# Patient Record
Sex: Female | Born: 1985 | Race: White | Hispanic: No | Marital: Married | Smoking: Never smoker
Health system: Southern US, Community
[De-identification: ages and names within clinical notes are randomized; demographics above are authoritative.]

## PROBLEM LIST (undated history)

## (undated) DIAGNOSIS — Z973 Presence of spectacles and contact lenses: Secondary | ICD-10-CM

## (undated) DIAGNOSIS — Z8619 Personal history of other infectious and parasitic diseases: Secondary | ICD-10-CM

## (undated) DIAGNOSIS — Z87412 Personal history of vulvar dysplasia: Secondary | ICD-10-CM

## (undated) DIAGNOSIS — K5909 Other constipation: Secondary | ICD-10-CM

## (undated) DIAGNOSIS — K601 Chronic anal fissure: Secondary | ICD-10-CM

## (undated) DIAGNOSIS — Z87898 Personal history of other specified conditions: Secondary | ICD-10-CM

## (undated) DIAGNOSIS — Z8742 Personal history of other diseases of the female genital tract: Secondary | ICD-10-CM

## (undated) DIAGNOSIS — K219 Gastro-esophageal reflux disease without esophagitis: Secondary | ICD-10-CM

## (undated) HISTORY — PX: KNEE ARTHROSCOPY: SUR90

## (undated) HISTORY — DX: Personal history of other infectious and parasitic diseases: Z86.19

## (undated) HISTORY — PX: OTHER SURGICAL HISTORY: SHX169

---
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
## 2007-05-21 ENCOUNTER — Encounter: Admission: RE | Admit: 2007-05-21 | Discharge: 2007-05-21 | Payer: Self-pay | Admitting: Gastroenterology

## 2007-06-29 ENCOUNTER — Other Ambulatory Visit: Admission: RE | Admit: 2007-06-29 | Discharge: 2007-06-29 | Payer: Self-pay | Admitting: Obstetrics and Gynecology

## 2008-01-04 ENCOUNTER — Other Ambulatory Visit: Admission: RE | Admit: 2008-01-04 | Discharge: 2008-01-04 | Payer: Self-pay | Admitting: Obstetrics and Gynecology

## 2008-07-07 ENCOUNTER — Other Ambulatory Visit: Admission: RE | Admit: 2008-07-07 | Discharge: 2008-07-07 | Payer: Self-pay | Admitting: Obstetrics and Gynecology

## 2008-10-19 IMAGING — RF DG UGI W/ HIGH DENSITY W/KUB
15 of 19 series · 18 of 24 positions shown · non-contrast
Comparison: Abdominal ultrasound, 05/21/07.

CLINICAL DATA: Post prandial abdominal pain, bloating for two months.  Alternating diarrhea and constipation and nausea.
 UPPER GI WITH HIGH DENSITY WITH KUB:

[Series 1: run · 1 of 1 slices shown (1 of 14)]
[im 1/1]
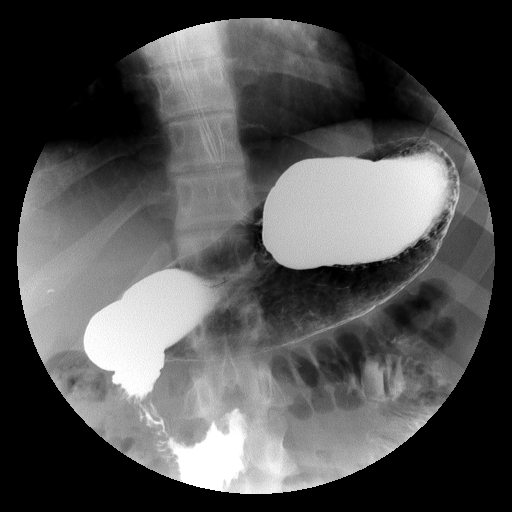

[Series 3: run · 1 of 1 slices shown (2 of 14)]
[im 1/1]
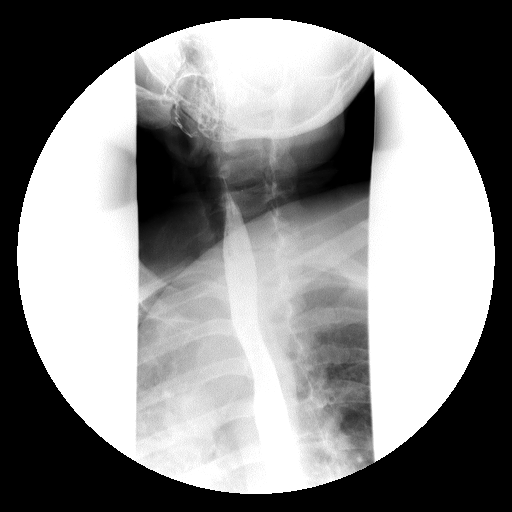

[Series 4: run · 2 of 4 slices shown (3 of 14)]
[im 1/4]
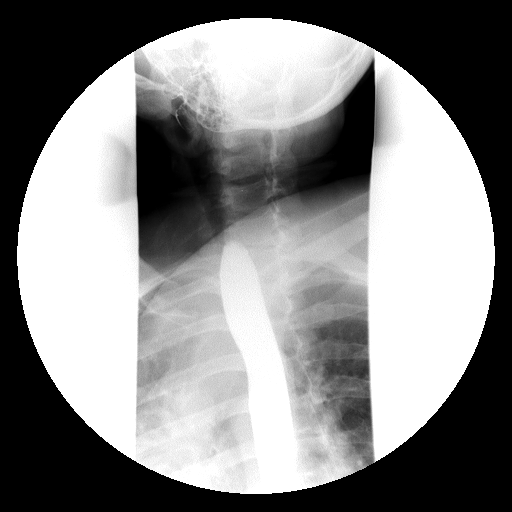
[im 2/4]
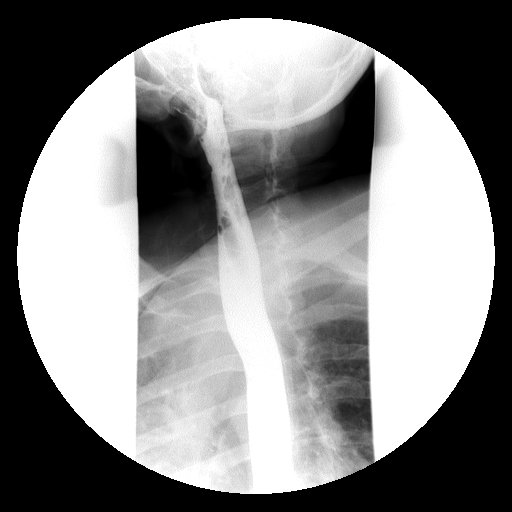

[Series 5: run · 2 of 2 slices shown (4 of 14)]
[im 1/2]
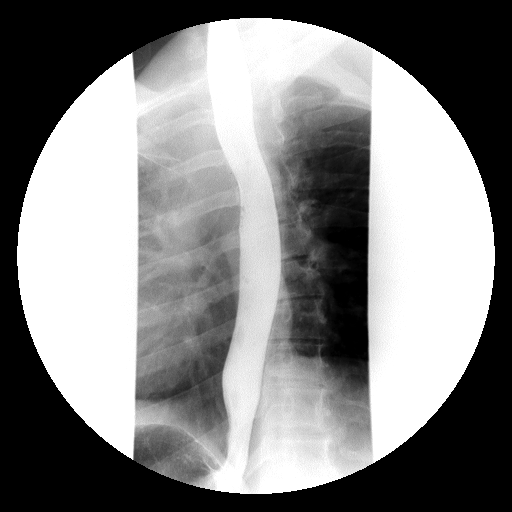
[im 2/2]
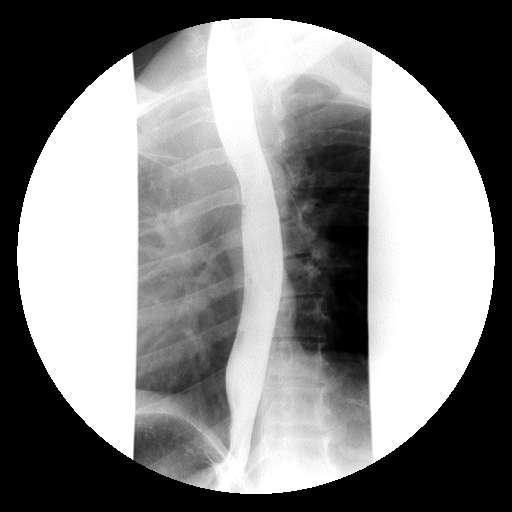

[Series 6: run · 2 of 3 slices shown (5 of 14)]
[im 1/3]
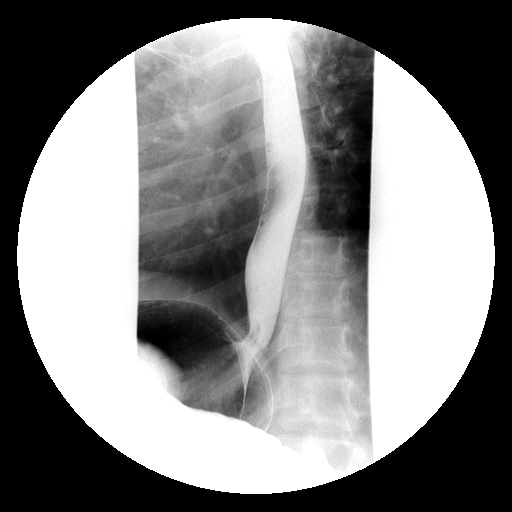
[im 3/3]
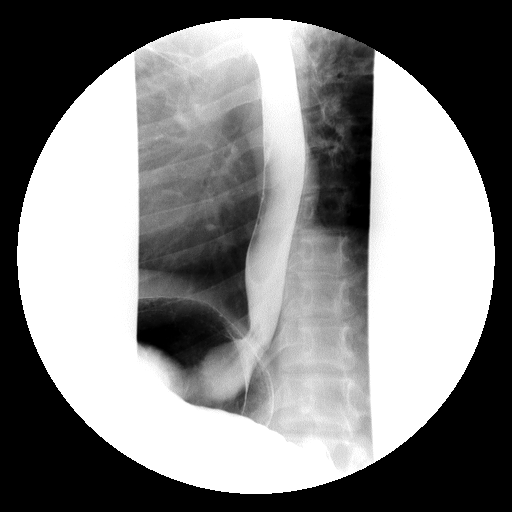

[Series 7: run · 1 of 1 slices shown (6 of 14)]
[im 1/1]
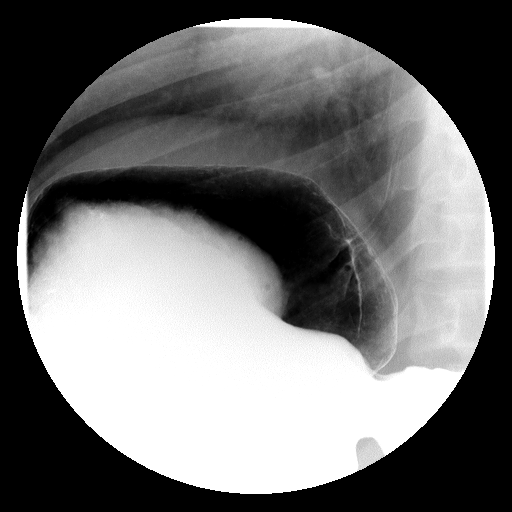

[Series 8: run · 1 of 1 slices shown (7 of 14)]
[im 1/1]
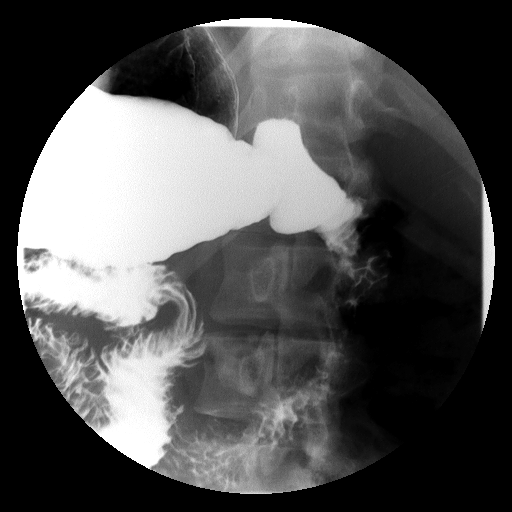

[Series 10: run · 1 of 1 slices shown (8 of 14)]
[im 1/1]
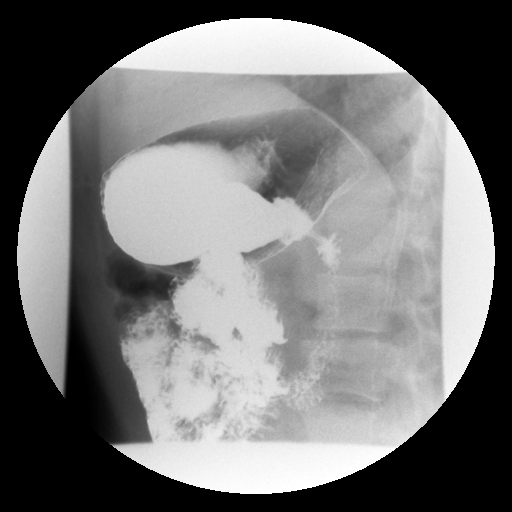

[Series 11: run · 1 of 1 slices shown (9 of 14)]
[im 1/1]
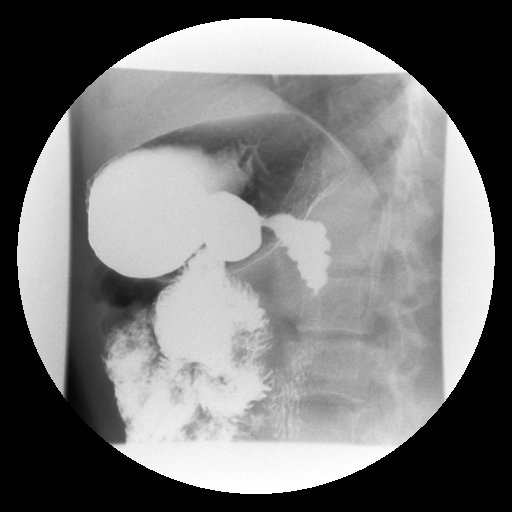

[Series 12: run · 1 of 1 slices shown (10 of 14)]
[im 1/1]
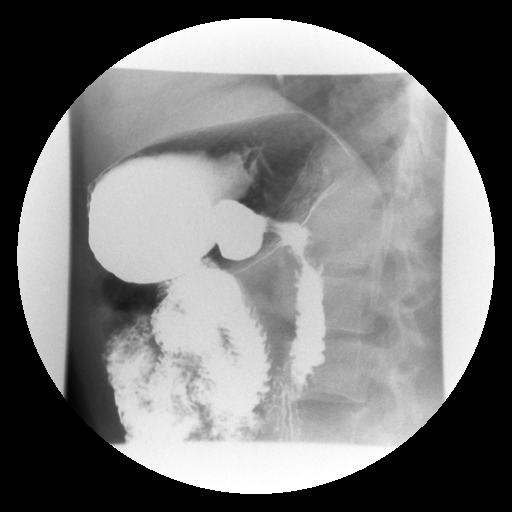

[Series 14: run · 1 of 1 slices shown (11 of 14)]
[im 1/1]
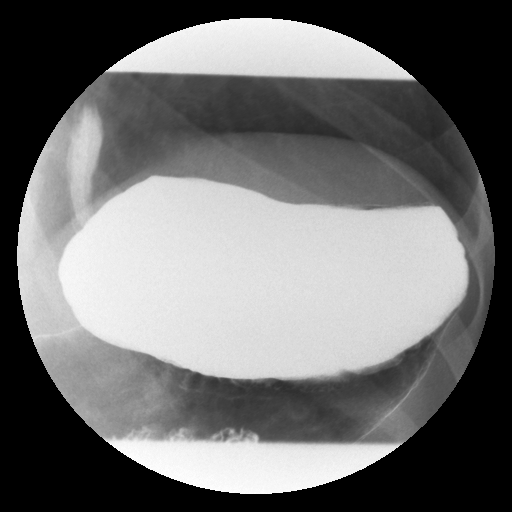

[Series 15: run · 1 of 1 slices shown (12 of 14)]
[im 1/1]
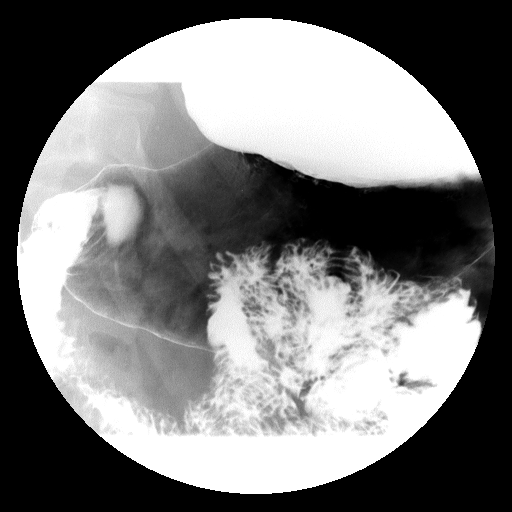

[Series 16: run · 1 of 1 slices shown (13 of 14)]
[im 1/1]
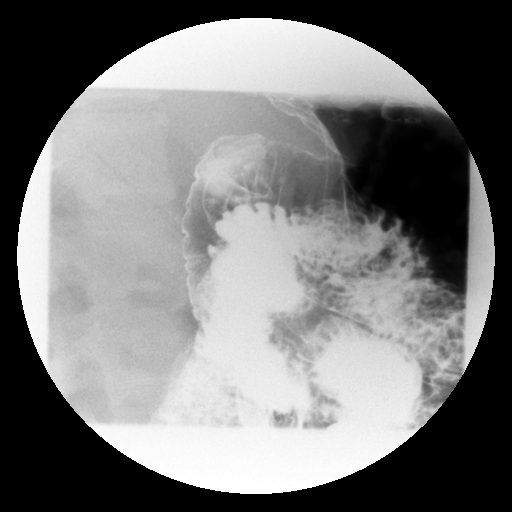

[Series 18: run · 1 of 1 slices shown (14 of 14)]
[im 1/1]
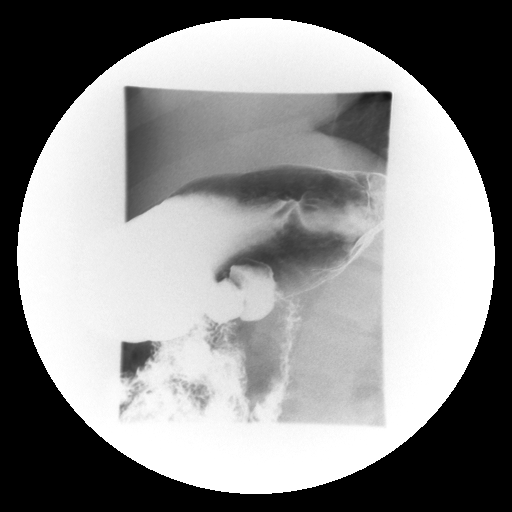

[Series 1001: view not recorded · 0.20mm/px · 1 of 1 slices shown]
[im 1/1]
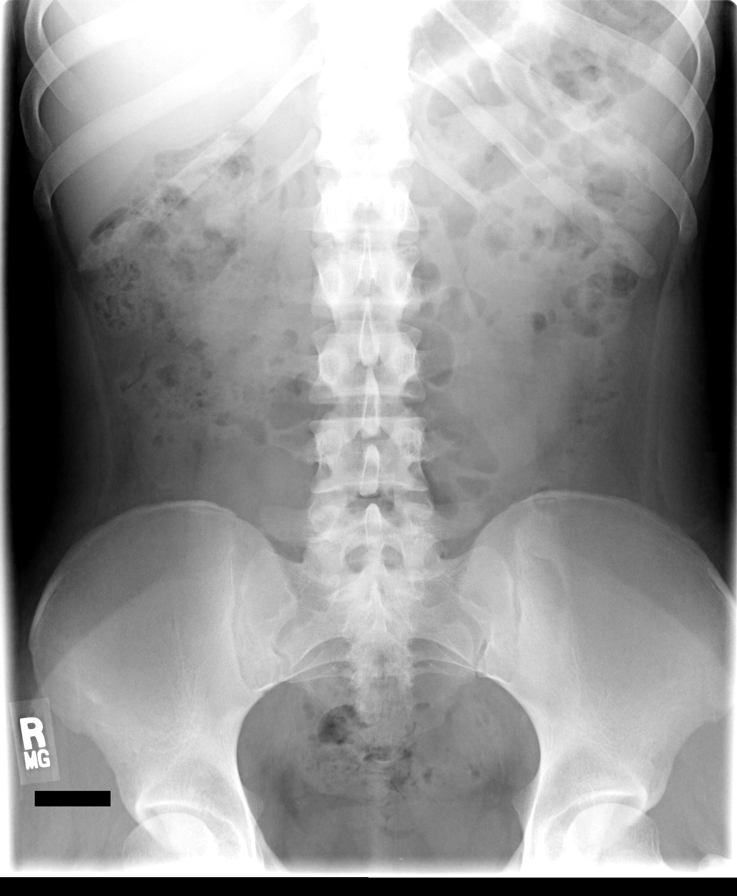

[18 of 24 positions shown; findings below may reference images not displayed]

FINDINGS: The preliminary abdominal radiograph is unremarkable.
 Pediatric technique was utilized.  The esophagus is normal in appearance. There is no evidence of hiatal hernia or gastroesophageal reflux.   Esophageal motility is within normal limits. The stomach, duodenal bulb, and remainder of the duodenum are normal in appearance.
IMPRESSION: Normal upper GI series.

## 2009-02-16 ENCOUNTER — Ambulatory Visit: Admission: RE | Admit: 2009-02-16 | Discharge: 2009-02-16 | Payer: Self-pay | Admitting: Gynecology

## 2009-06-20 ENCOUNTER — Inpatient Hospital Stay (HOSPITAL_COMMUNITY): Admission: AD | Admit: 2009-06-20 | Discharge: 2009-06-22 | Payer: Self-pay | Admitting: Obstetrics and Gynecology

## 2009-10-31 ENCOUNTER — Ambulatory Visit (HOSPITAL_COMMUNITY): Admission: RE | Admit: 2009-10-31 | Discharge: 2009-10-31 | Payer: Self-pay | Admitting: Obstetrics and Gynecology

## 2010-06-30 ENCOUNTER — Encounter: Payer: Self-pay | Admitting: Sports Medicine

## 2010-08-25 LAB — CBC
HCT: 39.9 % (ref 36.0–46.0)
Hemoglobin: 11.2 g/dL — ABNORMAL LOW (ref 12.0–15.0)
MCHC: 33.5 g/dL (ref 30.0–36.0)
Platelets: 235 10*3/uL (ref 150–400)
RBC: 3.74 MIL/uL — ABNORMAL LOW (ref 3.87–5.11)
RBC: 4.52 MIL/uL (ref 3.87–5.11)
WBC: 12.9 10*3/uL — ABNORMAL HIGH (ref 4.0–10.5)

## 2010-08-26 LAB — CBC
Hemoglobin: 13.9 g/dL (ref 12.0–15.0)
MCHC: 34.2 g/dL (ref 30.0–36.0)
MCV: 86.4 fL (ref 78.0–100.0)
RBC: 4.71 MIL/uL (ref 3.87–5.11)

## 2011-06-10 NOTE — L&D Delivery Note (Signed)
After amniotomy the patient rapidly progressed to C/C/+2. Second stage was brief. She had a SVD of one live viable infant over an intact perineum. Nuchal cord x 1.  Baby to NBN. Placenta-S/I. EBL-400cc.

## 2011-08-06 ENCOUNTER — Encounter: Payer: Self-pay | Admitting: Registered Nurse

## 2011-09-08 ENCOUNTER — Other Ambulatory Visit: Payer: Self-pay

## 2011-10-06 LAB — OB RESULTS CONSOLE RPR: RPR: NONREACTIVE

## 2011-10-06 LAB — OB RESULTS CONSOLE HEPATITIS B SURFACE ANTIGEN: Hepatitis B Surface Ag: NEGATIVE

## 2011-10-06 LAB — OB RESULTS CONSOLE HIV ANTIBODY (ROUTINE TESTING): HIV: NONREACTIVE

## 2011-10-06 LAB — OB RESULTS CONSOLE ABO/RH: RH Type: POSITIVE

## 2012-04-11 ENCOUNTER — Inpatient Hospital Stay (HOSPITAL_COMMUNITY): Admission: AD | Admit: 2012-04-11 | Payer: Self-pay | Source: Ambulatory Visit | Admitting: Obstetrics and Gynecology

## 2012-04-14 ENCOUNTER — Encounter (HOSPITAL_COMMUNITY): Payer: Self-pay | Admitting: *Deleted

## 2012-04-14 ENCOUNTER — Telehealth (HOSPITAL_COMMUNITY): Payer: Self-pay | Admitting: *Deleted

## 2012-04-14 NOTE — Telephone Encounter (Signed)
Preadmission screen  

## 2012-04-18 ENCOUNTER — Inpatient Hospital Stay (HOSPITAL_COMMUNITY)
Admission: RE | Admit: 2012-04-18 | Discharge: 2012-04-20 | DRG: 775 | Disposition: A | Discharge status: Home / Self Care | Payer: Medicaid Other | Source: Ambulatory Visit | Attending: Obstetrics & Gynecology | Admitting: Obstetrics & Gynecology

## 2012-04-18 ENCOUNTER — Encounter (HOSPITAL_COMMUNITY): Payer: Self-pay

## 2012-04-18 DIAGNOSIS — O480 Post-term pregnancy: Principal | ICD-10-CM | POA: Diagnosis present

## 2012-04-18 DIAGNOSIS — O99892 Other specified diseases and conditions complicating childbirth: Secondary | ICD-10-CM | POA: Diagnosis present

## 2012-04-18 DIAGNOSIS — Z22330 Carrier of Group B streptococcus: Secondary | ICD-10-CM

## 2012-04-18 LAB — CBC
Hemoglobin: 12.7 g/dL (ref 12.0–15.0)
MCH: 29.1 pg (ref 26.0–34.0)
MCV: 86 fL (ref 78.0–100.0)
Platelets: 201 10*3/uL (ref 150–400)
RBC: 4.37 MIL/uL (ref 3.87–5.11)
WBC: 12 10*3/uL — ABNORMAL HIGH (ref 4.0–10.5)

## 2012-04-18 MED ORDER — LACTATED RINGERS IV SOLN
INTRAVENOUS | Status: DC
  Administered 2012-04-18 – 2012-04-19 (×2): via INTRAVENOUS

## 2012-04-18 MED ORDER — BUTORPHANOL TARTRATE 1 MG/ML IJ SOLN: 1.0000 mg | INTRAMUSCULAR | Status: DC | PRN

## 2012-04-18 MED ORDER — LACTATED RINGERS IV SOLN
500.0000 mL | INTRAVENOUS | Status: DC | PRN
Start: 2012-04-18 — End: 2012-04-19

## 2012-04-18 MED ORDER — CITRIC ACID-SODIUM CITRATE 334-500 MG/5ML PO SOLN: 30.0000 mL | ORAL | Status: DC | PRN

## 2012-04-18 MED ORDER — ACETAMINOPHEN 325 MG PO TABS: 650.0000 mg | ORAL_TABLET | ORAL | Status: DC | PRN

## 2012-04-18 MED ORDER — PENICILLIN G POTASSIUM 5000000 UNITS IJ SOLR
2.5000 10*6.[IU] | INTRAMUSCULAR | Status: DC
  Administered 2012-04-19 (×2): 2.5 10*6.[IU] via INTRAVENOUS
  Filled 2012-04-18 (×5): qty 2.5

## 2012-04-18 MED ORDER — OXYTOCIN 40 UNITS IN LACTATED RINGERS INFUSION - SIMPLE MED
1.0000 m[IU]/min | INTRAVENOUS | Status: DC
  Administered 2012-04-19: 2 m[IU]/min via INTRAVENOUS
  Filled 2012-04-18: qty 1000

## 2012-04-18 MED ORDER — IBUPROFEN 600 MG PO TABS
600.0000 mg | ORAL_TABLET | Freq: Four times a day (QID) | ORAL | Status: DC | PRN
  Administered 2012-04-19: 600 mg via ORAL
  Filled 2012-04-18: qty 1

## 2012-04-18 MED ORDER — MISOPROSTOL 25 MCG QUARTER TABLET
25.0000 ug | ORAL_TABLET | ORAL | Status: DC | PRN
  Administered 2012-04-18 – 2012-04-19 (×2): 25 ug via VAGINAL
  Filled 2012-04-18 (×2): qty 0.25

## 2012-04-18 MED ORDER — TERBUTALINE SULFATE 1 MG/ML IJ SOLN: 0.2500 mg | Freq: Once | INTRAMUSCULAR | Status: AC | PRN

## 2012-04-18 MED ORDER — PENICILLIN G POTASSIUM 5000000 UNITS IJ SOLR
5.0000 10*6.[IU] | Freq: Once | INTRAVENOUS | Status: AC
  Administered 2012-04-19: 5 10*6.[IU] via INTRAVENOUS
  Filled 2012-04-18: qty 5

## 2012-04-18 MED ORDER — ZOLPIDEM TARTRATE 5 MG PO TABS: 5.0000 mg | ORAL_TABLET | Freq: Every evening | ORAL | Status: DC | PRN

## 2012-04-18 MED ORDER — OXYTOCIN 40 UNITS IN LACTATED RINGERS INFUSION - SIMPLE MED
62.5000 mL/h | INTRAVENOUS | Status: DC
  Administered 2012-04-19: 62.5 mL/h via INTRAVENOUS

## 2012-04-18 MED ORDER — LIDOCAINE HCL (PF) 1 % IJ SOLN
30.0000 mL | INTRAMUSCULAR | Status: DC | PRN
  Filled 2012-04-18: qty 30

## 2012-04-18 MED ORDER — OXYCODONE-ACETAMINOPHEN 5-325 MG PO TABS: 1.0000 | ORAL_TABLET | ORAL | Status: DC | PRN

## 2012-04-18 MED ORDER — ONDANSETRON HCL 4 MG/2ML IJ SOLN: 4.0000 mg | Freq: Four times a day (QID) | INTRAMUSCULAR | Status: DC | PRN

## 2012-04-18 MED ORDER — OXYTOCIN BOLUS FROM INFUSION: 500.0000 mL | INTRAVENOUS | Status: DC

## 2012-04-18 NOTE — H&P (Signed)
26 y.o. G2P1001  Estimated Date of Delivery: 04/11/12 admitted at [redacted] weeks gestation for induction.  Prenatal Transfer Tool  Maternal Diabetes: No Genetic Screening: Normal Maternal Ultrasounds/Referrals: Normal Fetal Ultrasounds or other Referrals:  None Maternal Substance Abuse:  No Significant Maternal Medications:  None Significant Maternal Lab Results: Lab values include: Group B Strep positive Other Significant Pregnancy Complications:  None  Afebrile, VSS Heart and Lungs: No active disease Abdomen: soft, gravid, EFW AGA. Cervical exam:  1-2/50.  Impression: Post dates pregnancy  Plan:  Cytotec tonight, IV pitocin in AM tomorrow; GBS prophylaxis.

## 2012-04-19 ENCOUNTER — Encounter (HOSPITAL_COMMUNITY): Payer: Self-pay | Admitting: Anesthesiology

## 2012-04-19 ENCOUNTER — Inpatient Hospital Stay (HOSPITAL_COMMUNITY): Payer: Medicaid Other | Admitting: Anesthesiology

## 2012-04-19 ENCOUNTER — Encounter (HOSPITAL_COMMUNITY): Payer: Self-pay

## 2012-04-19 LAB — RPR: RPR Ser Ql: NONREACTIVE

## 2012-04-19 MED ORDER — ONDANSETRON HCL 4 MG/2ML IJ SOLN: 4.0000 mg | INTRAMUSCULAR | Status: DC | PRN

## 2012-04-19 MED ORDER — FENTANYL 2.5 MCG/ML BUPIVACAINE 1/10 % EPIDURAL INFUSION (WH - ANES)
14.0000 mL/h | INTRAMUSCULAR | Status: DC
  Filled 2012-04-19: qty 125

## 2012-04-19 MED ORDER — ZOLPIDEM TARTRATE 5 MG PO TABS: 5.0000 mg | ORAL_TABLET | Freq: Every evening | ORAL | Status: DC | PRN

## 2012-04-19 MED ORDER — PHENYLEPHRINE 40 MCG/ML (10ML) SYRINGE FOR IV PUSH (FOR BLOOD PRESSURE SUPPORT): 80.0000 ug | PREFILLED_SYRINGE | INTRAVENOUS | Status: DC | PRN

## 2012-04-19 MED ORDER — DIPHENHYDRAMINE HCL 50 MG/ML IJ SOLN: 12.5000 mg | INTRAMUSCULAR | Status: DC | PRN

## 2012-04-19 MED ORDER — DIBUCAINE 1 % RE OINT
1.0000 "application " | TOPICAL_OINTMENT | RECTAL | Status: DC | PRN
  Administered 2012-04-19: 1 via RECTAL
  Filled 2012-04-19: qty 28

## 2012-04-19 MED ORDER — ONDANSETRON HCL 4 MG PO TABS: 4.0000 mg | ORAL_TABLET | ORAL | Status: DC | PRN

## 2012-04-19 MED ORDER — SIMETHICONE 80 MG PO CHEW: 80.0000 mg | CHEWABLE_TABLET | ORAL | Status: DC | PRN

## 2012-04-19 MED ORDER — WITCH HAZEL-GLYCERIN EX PADS
1.0000 "application " | MEDICATED_PAD | CUTANEOUS | Status: DC | PRN
  Administered 2012-04-19: 1 via TOPICAL

## 2012-04-19 MED ORDER — OXYCODONE-ACETAMINOPHEN 5-325 MG PO TABS: 1.0000 | ORAL_TABLET | ORAL | Status: DC | PRN

## 2012-04-19 MED ORDER — LACTATED RINGERS IV SOLN: 500.0000 mL | Freq: Once | INTRAVENOUS | Status: DC

## 2012-04-19 MED ORDER — EPHEDRINE 5 MG/ML INJ
10.0000 mg | INTRAVENOUS | Status: DC | PRN
  Filled 2012-04-19: qty 4

## 2012-04-19 MED ORDER — FENTANYL 2.5 MCG/ML BUPIVACAINE 1/10 % EPIDURAL INFUSION (WH - ANES)
INTRAMUSCULAR | Status: DC | PRN
  Administered 2012-04-19: 14 mL/h via EPIDURAL

## 2012-04-19 MED ORDER — TETANUS-DIPHTH-ACELL PERTUSSIS 5-2.5-18.5 LF-MCG/0.5 IM SUSP: 0.5000 mL | Freq: Once | INTRAMUSCULAR | Status: DC

## 2012-04-19 MED ORDER — EPHEDRINE 5 MG/ML INJ: 10.0000 mg | INTRAVENOUS | Status: DC | PRN

## 2012-04-19 MED ORDER — LIDOCAINE HCL (PF) 1 % IJ SOLN
INTRAMUSCULAR | Status: DC | PRN
  Administered 2012-04-19 (×2): 5 mL

## 2012-04-19 MED ORDER — IBUPROFEN 600 MG PO TABS
600.0000 mg | ORAL_TABLET | Freq: Four times a day (QID) | ORAL | Status: DC
  Administered 2012-04-19 – 2012-04-20 (×4): 600 mg via ORAL
  Filled 2012-04-19 (×3): qty 1

## 2012-04-19 MED ORDER — PHENYLEPHRINE 40 MCG/ML (10ML) SYRINGE FOR IV PUSH (FOR BLOOD PRESSURE SUPPORT)
80.0000 ug | PREFILLED_SYRINGE | INTRAVENOUS | Status: DC | PRN
  Filled 2012-04-19: qty 5

## 2012-04-19 MED ORDER — BENZOCAINE-MENTHOL 20-0.5 % EX AERO
1.0000 "application " | INHALATION_SPRAY | CUTANEOUS | Status: DC | PRN
  Administered 2012-04-19: 1 via TOPICAL
  Filled 2012-04-19: qty 56

## 2012-04-19 MED ORDER — MEASLES, MUMPS & RUBELLA VAC ~~LOC~~ INJ: 0.5000 mL | INJECTION | Freq: Once | SUBCUTANEOUS | Status: DC

## 2012-04-19 NOTE — Progress Notes (Signed)
Delivery of live viable female by Dr Anderson. APGARS 9,9  

## 2012-04-19 NOTE — Anesthesia Procedure Notes (Signed)
Epidural Patient location during procedure: OB Start time: 04/19/2012 9:12 AM  Staffing Anesthesiologist: Brayton Caves R Performed by: anesthesiologist   Preanesthetic Checklist Completed: patient identified, site marked, surgical consent, pre-op evaluation, timeout performed, IV checked, risks and benefits discussed and monitors and equipment checked  Epidural Patient position: sitting Prep: site prepped and draped and DuraPrep Patient monitoring: continuous pulse ox and blood pressure Approach: midline Injection technique: LOR air and LOR saline  Needle:  Needle type: Tuohy  Needle gauge: 17 G Needle length: 9 cm and 9 Needle insertion depth: 5 cm cm Catheter type: closed end flexible Catheter size: 19 Gauge Catheter at skin depth: 10 cm Test dose: negative  Assessment Events: blood not aspirated, injection not painful, no injection resistance, negative IV test and no paresthesia  Additional Notes Patient identified.  Risk benefits discussed including failed block, incomplete pain control, headache, nerve damage, paralysis, blood pressure changes, nausea, vomiting, reactions to medication both toxic or allergic, and postpartum back pain.  Patient expressed understanding and wished to proceed.  All questions were answered.  Sterile technique used throughout procedure and epidural site dressed with sterile barrier dressing. No paresthesia or other complications noted.The patient did not experience any signs of intravascular injection such as tinnitus or metallic taste in mouth nor signs of intrathecal spread such as rapid motor block. Please see nursing notes for vital signs.

## 2012-04-19 NOTE — Anesthesia Preprocedure Evaluation (Signed)
Anesthesia Evaluation  Patient identified by MRN, date of birth, ID band Patient awake    Reviewed: Allergy & Precautions, H&P , Patient's Chart, lab work & pertinent test results  Airway Mallampati: II TM Distance: >3 FB Neck ROM: full    Dental No notable dental hx.    Pulmonary neg pulmonary ROS,  breath sounds clear to auscultation  Pulmonary exam normal       Cardiovascular negative cardio ROS  Rhythm:regular Rate:Normal     Neuro/Psych negative neurological ROS  negative psych ROS   GI/Hepatic negative GI ROS, Neg liver ROS,   Endo/Other  negative endocrine ROS  Renal/GU negative Renal ROS     Musculoskeletal   Abdominal   Peds  Hematology negative hematology ROS (+)   Anesthesia Other Findings Vulvar intraepithelial neoplasia III (VIN III)     Abnormal Pap smear        H/O varicella     Seizures    Reproductive/Obstetrics (+) Pregnancy                           Anesthesia Physical Anesthesia Plan  ASA: II  Anesthesia Plan: Epidural   Post-op Pain Management:    Induction:   Airway Management Planned:   Additional Equipment:   Intra-op Plan:   Post-operative Plan:   Informed Consent: I have reviewed the patients History and Physical, chart, labs and discussed the procedure including the risks, benefits and alternatives for the proposed anesthesia with the patient or authorized representative who has indicated his/her understanding and acceptance.     Plan Discussed with:   Anesthesia Plan Comments:         Anesthesia Quick Evaluation

## 2012-04-20 LAB — CBC
Hemoglobin: 11.8 g/dL — ABNORMAL LOW (ref 12.0–15.0)
MCH: 28.5 pg (ref 26.0–34.0)
MCHC: 32.6 g/dL (ref 30.0–36.0)
MCV: 87.4 fL (ref 78.0–100.0)
Platelets: 170 10*3/uL (ref 150–400)
RBC: 4.14 MIL/uL (ref 3.87–5.11)

## 2012-04-20 MED ORDER — IBUPROFEN 600 MG PO TABS: 600.0000 mg | ORAL_TABLET | Freq: Four times a day (QID) | ORAL | Status: DC | PRN

## 2012-04-20 MED ORDER — DOCUSATE SODIUM 100 MG PO CAPS: 100.0000 mg | ORAL_CAPSULE | Freq: Two times a day (BID) | ORAL | Status: DC

## 2012-04-20 MED ORDER — HYDROCODONE-ACETAMINOPHEN 5-500 MG PO TABS: 1.0000 | ORAL_TABLET | ORAL | Status: DC | PRN

## 2012-04-20 NOTE — Anesthesia Postprocedure Evaluation (Signed)
  Anesthesia Post-op Note  Patient: Hannah Mckenzie  Procedure(s) Performed: * No procedures listed *  Patient Location: Mother/Baby  Anesthesia Type:Epidural  Level of Consciousness: awake, alert  and oriented  Airway and Oxygen Therapy: Patient Spontanous Breathing  Post-op Pain: none  Post-op Assessment: Post-op Vital signs reviewed, Patient's Cardiovascular Status Stable, No headache, No backache, No residual numbness and No residual motor weakness  Post-op Vital Signs: Reviewed and stable  Complications: No apparent anesthesia complications

## 2012-04-20 NOTE — Progress Notes (Signed)
UR chart review completed.  

## 2012-04-29 NOTE — Discharge Summary (Signed)
Obstetric Discharge Summary Reason for Admission: induction of labor Prenatal Procedures: ultrasound Intrapartum Procedures: spontaneous vaginal delivery Postpartum Procedures: none Complications-Operative and Postpartum: none Hemoglobin  Date Value Range Status  04/20/2012 11.8* 12.0 - 15.0 g/dL Final     HCT  Date Value Range Status  04/20/2012 36.2  36.0 - 46.0 % Final    Physical Exam:  General: alert, cooperative and appears stated age 25: appropriate Uterine Fundus: firm   Discharge Diagnoses: Term Pregnancy-delivered  Discharge Information: Date: 04/29/2012 Activity: pelvic rest Diet: routine Medications: Ibuprofen, Colace and Vicodin Condition: stable Instructions: refer to practice specific booklet Discharge to: home   Newborn Data: Live born female  Birth Weight: 7 lb 2 oz (3232 g) APGAR: 9, 9  Home with mother.  Asjah Rauda H. 04/29/2012, 9:10 AM

## 2013-12-19 ENCOUNTER — Other Ambulatory Visit: Payer: Self-pay | Admitting: Obstetrics and Gynecology

## 2013-12-20 LAB — CYTOLOGY - PAP

## 2014-04-10 ENCOUNTER — Encounter (HOSPITAL_COMMUNITY): Payer: Self-pay

## 2016-04-09 ENCOUNTER — Other Ambulatory Visit: Payer: Self-pay | Admitting: Obstetrics and Gynecology

## 2016-04-09 DIAGNOSIS — Z01419 Encounter for gynecological examination (general) (routine) without abnormal findings: Secondary | ICD-10-CM | POA: Diagnosis not present

## 2016-04-09 DIAGNOSIS — Z6820 Body mass index (BMI) 20.0-20.9, adult: Secondary | ICD-10-CM | POA: Diagnosis not present

## 2016-04-09 DIAGNOSIS — Z124 Encounter for screening for malignant neoplasm of cervix: Secondary | ICD-10-CM | POA: Diagnosis not present

## 2016-04-10 LAB — CYTOLOGY - PAP

## 2016-05-15 ENCOUNTER — Encounter: Payer: Self-pay | Admitting: Obstetrics and Gynecology

## 2016-06-18 ENCOUNTER — Other Ambulatory Visit: Payer: Self-pay | Admitting: Obstetrics and Gynecology

## 2016-06-18 DIAGNOSIS — Z6821 Body mass index (BMI) 21.0-21.9, adult: Secondary | ICD-10-CM | POA: Diagnosis not present

## 2016-06-18 DIAGNOSIS — D071 Carcinoma in situ of vulva: Secondary | ICD-10-CM | POA: Diagnosis not present

## 2016-06-18 DIAGNOSIS — N909 Noninflammatory disorder of vulva and perineum, unspecified: Secondary | ICD-10-CM | POA: Diagnosis not present

## 2016-06-30 ENCOUNTER — Encounter: Payer: Self-pay | Admitting: Gynecologic Oncology

## 2016-07-14 ENCOUNTER — Encounter (INDEPENDENT_AMBULATORY_CARE_PROVIDER_SITE_OTHER): Payer: Self-pay

## 2016-07-14 ENCOUNTER — Ambulatory Visit: Payer: BLUE CROSS/BLUE SHIELD | Attending: Gynecologic Oncology | Admitting: Gynecologic Oncology

## 2016-07-14 ENCOUNTER — Encounter: Payer: Self-pay | Admitting: Gynecologic Oncology

## 2016-07-14 VITALS — BP 103/72 | HR 61 | Temp 97.9°F | Resp 18 | Ht 66.0 in | Wt 133.8 lb

## 2016-07-14 DIAGNOSIS — D071 Carcinoma in situ of vulva: Secondary | ICD-10-CM | POA: Diagnosis not present

## 2016-07-14 NOTE — Progress Notes (Signed)
Consult Note: Gyn-Onc  Consult was requested by Dr. Waynard ReedsKendra Ross for the evaluation of Hannah Mckenzie [age] y.o. female  CC:  Chief Complaint  Patient presents with  . VIN-lll    Assessment/Plan:  Ms. Hannah Mckenzie  is a 31 y.o.  year old with VIN 3.  The lesion is localized to the posterior left labia minora. I believe she is a good candidate for primary excision.  I discussed the surgical procedure, its risks and anticipated recovery.  Surgery is scheduled for February 20th.   HPI: Hannah Mckenzie is a 31 year old woman who is seen in consultation at the request of Dr Waynard ReedsKendra Ross for VIN3.   The patient has a history of right labian VIN in 2011 which was treated with excision.  She began experiencing vulvar irritation on the left in the summer of 2017. It was inititally treated with topical cream, however, when it persisted it was biopsied on 06/18/16 which revealed VIN III.  Current Meds:  Outpatient Encounter Prescriptions as of 07/14/2016  Medication Sig  . [DISCONTINUED] docusate sodium (COLACE) 100 MG capsule Take 1 capsule (100 mg total) by mouth 2 (two) times daily.  . [DISCONTINUED] docusate sodium (COLACE) 100 MG capsule Take 1 capsule (100 mg total) by mouth 2 (two) times daily.  . [DISCONTINUED] folic acid (FOLVITE) 1 MG tablet Take 1 mg by mouth daily.  . [DISCONTINUED] HYDROcodone-acetaminophen (VICODIN) 5-500 MG per tablet Take 1 tablet by mouth every 4 (four) hours as needed for pain.  . [DISCONTINUED] HYDROcodone-acetaminophen (VICODIN) 5-500 MG per tablet Take 1 tablet by mouth every 4 (four) hours as needed for pain.  . [DISCONTINUED] ibuprofen (ADVIL,MOTRIN) 600 MG tablet Take 1 tablet (600 mg total) by mouth every 6 (six) hours as needed for pain.  . [DISCONTINUED] ibuprofen (ADVIL,MOTRIN) 600 MG tablet Take 1 tablet (600 mg total) by mouth every 6 (six) hours as needed for pain.  . [DISCONTINUED] Prenatal Vit-Fe Fumarate-FA (PRENATAL MULTIVITAMIN) TABS Take 1  tablet by mouth daily.   No facility-administered encounter medications on file as of 07/14/2016.     Allergy: No Known Allergies  Social Hx:   Social History   Social History  . Marital status: Married    Spouse name: N/A  . Number of children: N/A  . Years of education: N/A   Occupational History  . Not on file.   Social History Main Topics  . Smoking status: Never Smoker  . Smokeless tobacco: Never Used  . Alcohol use No  . Drug use: No  . Sexual activity: Yes   Other Topics Concern  . Not on file   Social History Narrative  . No narrative on file    Past Surgical Hx:  Past Surgical History:  Procedure Laterality Date  . KNEE SURGERY    . vulvar excision     VIN III    Past Medical Hx:  Past Medical History:  Diagnosis Date  . Abnormal Pap smear   . H/O varicella   . Seizures (HCC)    unknown cause last 2012  . Vulvar intraepithelial neoplasia III (VIN III)     Past Gynecological History:  VIN 2011 No LMP recorded.  Family Hx:  Family History  Problem Relation Age of Onset  . Diabetes Maternal Grandmother   . Cancer Paternal Grandmother     lymphoma  . Heart disease Paternal Grandfather   . Birth defects Cousin     cleft palate 2nd cousin  Review of Systems:  Constitutional  Feels well,    ENT Normal appearing ears and nares bilaterally Skin/Breast  No rash, sores, jaundice, itching, dryness Cardiovascular  No chest pain, shortness of breath, or edema  Pulmonary  No cough or wheeze.  Gastro Intestinal  No nausea, vomitting, or diarrhoea. No bright red blood per rectum, no abdominal pain, change in bowel movement, or constipation.  Genito Urinary  No frequency, urgency, dysuria, + vulvar pruritis Musculo Skeletal  No myalgia, arthralgia, joint swelling or pain  Neurologic  No weakness, numbness, change in gait,  Psychology  No depression, anxiety, insomnia.   Vitals:  Blood pressure 103/72, pulse 61, temperature 97.9 F (36.6  C), temperature source Oral, resp. rate 18, height 5\' 6"  (1.676 m), weight 133 lb 12.8 oz (60.7 kg), SpO2 100 %, unknown if currently breastfeeding.  Physical Exam: WD in NAD Neck  Supple NROM, without any enlargements.  Lymph Node Survey No cervical supraclavicular or inguinal adenopathy CGenito Urinary  Vulva/vagina: Normal external female genitalia. 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 Addition of acetic acid fails to show additional lesions.  Rectal  deferred Extremities  No bilateral cyanosis, clubbing or edema.   Hannah Mckenzie, Hannah Catalina Caroline, MD  07/14/2016, 11:20 AM

## 2016-07-14 NOTE — Patient Instructions (Addendum)
You will be having a Wide Local Excision of the Vulva  With Dr. Andrey Farmerossi on 07-29-16 at [facility]. Blood Transfusion Information WHAT IS A BLOOD TRANSFUSION? A transfusion is the replacement of blood or some of its parts. Blood is made up of multiple cells which provide different functions.  Red blood cells carry oxygen and are used for blood loss replacement.  White blood cells fight against infection.  Platelets control bleeding.  Plasma helps clot blood.  Other blood products are available for specialized needs, such as hemophilia or other clotting disorders. BEFORE THE TRANSFUSION  Who gives blood for transfusions?   You may be able to donate blood to be used at a later date on yourself (autologous donation).  Relatives can be asked to donate blood. This is generally not any safer than if you have received blood from a stranger. The same precautions are taken to ensure safety when a relative's blood is donated.  Healthy volunteers who are fully evaluated to make sure their blood is safe. This is blood bank blood. Transfusion therapy is the safest it has ever been in the practice of medicine. Before blood is taken from a donor, a complete history is taken to make sure that person has no history of diseases nor engages in risky social behavior (examples are intravenous drug use or sexual activity with multiple partners). The donor's travel history is screened to minimize risk of transmitting infections, such as malaria. The donated blood is tested for signs of infectious diseases, such as HIV and hepatitis. The blood is then tested to be sure it is compatible with you in order to minimize the chance of a transfusion reaction. If you or a relative donates blood, this is often done in anticipation of surgery and is not appropriate for emergency situations. It takes many days to process the donated blood. RISKS AND COMPLICATIONS Although transfusion therapy is very safe  and saves many lives, the main dangers of transfusion include:   Getting an infectious disease.  Developing a transfusion reaction. This is an allergic reaction to something in the blood you were given. Every precaution is taken to prevent this. The decision to have a blood transfusion has been considered carefully by your caregiver before blood is given. Blood is not given unless the benefits outweigh the risks. Eat a light diet the day before surgery.  Examples including soups, broths, toast, yogurt, mashed potatoes.  Things to avoid include carbonated beverages (fizzy beverages), raw fruits and raw vegetables, or beans.   If your bowels are filled with gas, your surgeon will have difficulty visualizing your pelvic organs which increases your surgical risks.              Preparing for your Surgery  Plan for surgery on 07/29/16 with Dr. Adolphus BirchwoodEmma Rossi  Pre-operative Testing -You will receive a phone call from presurgical testing [facility] to arrange for a pre-operative testing appointment before your surgery.  This appointment normally occurs one to two weeks before your scheduled surgery.   -Bring your insurance card, copy of an advanced directive if applicable, medication list  -At that visit, you will be asked to sign a consent for a possible blood transfusion in case a transfusion becomes necessary during surgery.  The need for a blood transfusion is rare but having consent is a necessary part of your care.     -You should not be taking blood thinners or aspirin at least ten days prior  to surgery unless instructed by your surgeon.  Day Before Surgery at Home -You will be asked to take in a light diet the day before surgery.  Avoid carbonated beverages.  You will be advised to have nothing to eat or drink after midnight the evening before.     Eat a light diet the day before surgery.  Examples including soups, broths,  toast, yogurt, mashed potatoes.  Things to avoid include  carbonated beverages  (fizzy beverages), raw fruits and raw vegetables, or beans.    If your bowels are filled with gas, your surgeon will have difficulty  visualizing your pelvic organs which increases your surgical risks.  Your role in recovery Your role is to become active as soon as directed by your doctor, while still giving yourself time to heal.  Rest when you feel tired. You will be asked to do the following in order to speed your recovery:  - Cough and breathe deeply. This helps toclear and expand your lungs and can prevent pneumonia. You may be given a spirometer to practice deep breathing. A staff member will show you how to use the spirometer. - Do mild physical activity. Walking or moving your legs help your circulation and body functions return to normal. A staff member will help you when you try to walk and will provide you with simple exercises. Do not try to get up or walk alone the first time. - Actively manage your pain. Managing your pain lets you move in comfort. We will ask you to rate your pain on a scale of zero to 10. It is your responsibility to tell your doctor or nurse where and how much you hurt so your pain can be treated.  Special Considerations -If you are diabetic, you may be placed on insulin after surgery to have closer control over your blood sugars to promote healing and recovery.  This does not mean that you will be discharged on insulin.  If applicable, your oral antidiabetics will be resumed when you are tolerating a solid diet.  -Your final pathology results from surgery should be available by the Friday after surgery and the results will be relayed to you when available.

## 2016-07-23 ENCOUNTER — Encounter (HOSPITAL_BASED_OUTPATIENT_CLINIC_OR_DEPARTMENT_OTHER): Payer: Self-pay | Admitting: *Deleted

## 2016-07-23 NOTE — Progress Notes (Signed)
NPO AFTER MN.  ARRIVE AT 0830.  NEEDS HG AND URINE PREG.  

## 2016-07-29 ENCOUNTER — Ambulatory Visit (HOSPITAL_BASED_OUTPATIENT_CLINIC_OR_DEPARTMENT_OTHER)
Admission: RE | Admit: 2016-07-29 | Discharge: 2016-07-29 | Disposition: A | Discharge status: Home / Self Care | Payer: BLUE CROSS/BLUE SHIELD | Source: Ambulatory Visit | Attending: Gynecologic Oncology | Admitting: Gynecologic Oncology

## 2016-07-29 ENCOUNTER — Encounter (HOSPITAL_BASED_OUTPATIENT_CLINIC_OR_DEPARTMENT_OTHER)
Admission: RE | Disposition: A | Discharge status: Home / Self Care | Payer: Self-pay | Source: Ambulatory Visit | Attending: Gynecologic Oncology

## 2016-07-29 ENCOUNTER — Encounter (HOSPITAL_BASED_OUTPATIENT_CLINIC_OR_DEPARTMENT_OTHER): Payer: Self-pay | Admitting: Anesthesiology

## 2016-07-29 ENCOUNTER — Ambulatory Visit (HOSPITAL_BASED_OUTPATIENT_CLINIC_OR_DEPARTMENT_OTHER): Payer: BLUE CROSS/BLUE SHIELD | Admitting: Anesthesiology

## 2016-07-29 DIAGNOSIS — Z9889 Other specified postprocedural states: Secondary | ICD-10-CM | POA: Insufficient documentation

## 2016-07-29 DIAGNOSIS — Z8669 Personal history of other diseases of the nervous system and sense organs: Secondary | ICD-10-CM | POA: Insufficient documentation

## 2016-07-29 DIAGNOSIS — Z8619 Personal history of other infectious and parasitic diseases: Secondary | ICD-10-CM | POA: Insufficient documentation

## 2016-07-29 DIAGNOSIS — Z807 Family history of other malignant neoplasms of lymphoid, hematopoietic and related tissues: Secondary | ICD-10-CM | POA: Insufficient documentation

## 2016-07-29 DIAGNOSIS — Z8279 Family history of other congenital malformations, deformations and chromosomal abnormalities: Secondary | ICD-10-CM | POA: Diagnosis not present

## 2016-07-29 DIAGNOSIS — D071 Carcinoma in situ of vulva: Secondary | ICD-10-CM

## 2016-07-29 DIAGNOSIS — Z8249 Family history of ischemic heart disease and other diseases of the circulatory system: Secondary | ICD-10-CM | POA: Insufficient documentation

## 2016-07-29 HISTORY — DX: Personal history of vulvar dysplasia: Z87.412

## 2016-07-29 HISTORY — PX: VULVECTOMY: SHX1086

## 2016-07-29 HISTORY — DX: Personal history of other specified conditions: Z87.898

## 2016-07-29 HISTORY — DX: Presence of spectacles and contact lenses: Z97.3

## 2016-07-29 LAB — POCT HEMOGLOBIN-HEMACUE: Hemoglobin: 14.7 g/dL (ref 12.0–15.0)

## 2016-07-29 LAB — POCT PREGNANCY, URINE: PREG TEST UR: NEGATIVE

## 2016-07-29 SURGERY — WIDE EXCISION VULVECTOMY
Anesthesia: General | Site: Vulva

## 2016-07-29 MED ORDER — KETOROLAC TROMETHAMINE 30 MG/ML IJ SOLN
INTRAMUSCULAR | Status: DC | PRN
  Administered 2016-07-29: 30 mg via INTRAVENOUS

## 2016-07-29 MED ORDER — SENNA 8.6 MG PO TABS: 1.0000 | ORAL_TABLET | Freq: Every day | ORAL | 0 refills | Status: DC

## 2016-07-29 MED ORDER — ONDANSETRON HCL 4 MG/2ML IJ SOLN
INTRAMUSCULAR | Status: DC | PRN
  Administered 2016-07-29: 4 mg via INTRAVENOUS

## 2016-07-29 MED ORDER — ACETIC ACID 5 % SOLN
Status: DC | PRN
  Administered 2016-07-29: 1 via TOPICAL

## 2016-07-29 MED ORDER — FENTANYL CITRATE (PF) 100 MCG/2ML IJ SOLN
INTRAMUSCULAR | Status: AC
  Filled 2016-07-29: qty 2

## 2016-07-29 MED ORDER — OXYCODONE-ACETAMINOPHEN 5-325 MG PO TABS: 1.0000 | ORAL_TABLET | ORAL | 0 refills | Status: DC | PRN

## 2016-07-29 MED ORDER — DEXAMETHASONE SODIUM PHOSPHATE 10 MG/ML IJ SOLN
INTRAMUSCULAR | Status: AC
  Filled 2016-07-29: qty 1

## 2016-07-29 MED ORDER — HYDROMORPHONE HCL 1 MG/ML IJ SOLN
0.2500 mg | INTRAMUSCULAR | Status: DC | PRN
  Filled 2016-07-29: qty 0.5

## 2016-07-29 MED ORDER — PROMETHAZINE HCL 25 MG/ML IJ SOLN
6.2500 mg | INTRAMUSCULAR | Status: DC | PRN
  Filled 2016-07-29: qty 1

## 2016-07-29 MED ORDER — PROPOFOL 10 MG/ML IV BOLUS
INTRAVENOUS | Status: DC | PRN
Start: 2016-07-29 — End: 2016-07-29
  Administered 2016-07-29: 170 mg via INTRAVENOUS

## 2016-07-29 MED ORDER — ACETIC ACID 5 % SOLN
Status: AC
Start: 2016-07-29 — End: 2016-07-29
  Filled 2016-07-29: qty 500

## 2016-07-29 MED ORDER — MIDAZOLAM HCL 5 MG/5ML IJ SOLN
INTRAMUSCULAR | Status: DC | PRN
  Administered 2016-07-29: 2 mg via INTRAVENOUS

## 2016-07-29 MED ORDER — ONDANSETRON HCL 4 MG/2ML IJ SOLN
INTRAMUSCULAR | Status: AC
  Filled 2016-07-29: qty 2

## 2016-07-29 MED ORDER — OXYCODONE-ACETAMINOPHEN 5-325 MG PO TABS
ORAL_TABLET | ORAL | Status: AC
  Filled 2016-07-29: qty 1

## 2016-07-29 MED ORDER — LIDOCAINE HCL 1 % IJ SOLN
INTRAMUSCULAR | Status: AC
  Filled 2016-07-29: qty 20

## 2016-07-29 MED ORDER — SILVER NITRATE-POT NITRATE 75-25 % EX MISC
CUTANEOUS | Status: AC
  Filled 2016-07-29: qty 8

## 2016-07-29 MED ORDER — PROPOFOL 10 MG/ML IV BOLUS
INTRAVENOUS | Status: AC
  Filled 2016-07-29: qty 20

## 2016-07-29 MED ORDER — DEXAMETHASONE SODIUM PHOSPHATE 4 MG/ML IJ SOLN
INTRAMUSCULAR | Status: DC | PRN
  Administered 2016-07-29: 10 mg via INTRAVENOUS

## 2016-07-29 MED ORDER — LIDOCAINE 2% (20 MG/ML) 5 ML SYRINGE
INTRAMUSCULAR | Status: DC | PRN
  Administered 2016-07-29: 60 mg via INTRAVENOUS

## 2016-07-29 MED ORDER — LACTATED RINGERS IV SOLN
INTRAVENOUS | Status: DC
  Administered 2016-07-29 (×2): via INTRAVENOUS
  Filled 2016-07-29: qty 1000

## 2016-07-29 MED ORDER — MIDAZOLAM HCL 2 MG/2ML IJ SOLN
INTRAMUSCULAR | Status: AC
  Filled 2016-07-29: qty 2

## 2016-07-29 MED ORDER — FENTANYL CITRATE (PF) 100 MCG/2ML IJ SOLN
INTRAMUSCULAR | Status: DC | PRN
  Administered 2016-07-29: 50 ug via INTRAVENOUS

## 2016-07-29 MED ORDER — OXYCODONE-ACETAMINOPHEN 5-325 MG PO TABS
1.0000 | ORAL_TABLET | ORAL | Status: DC | PRN
  Administered 2016-07-29: 1 via ORAL
  Filled 2016-07-29: qty 2

## 2016-07-29 MED ORDER — LIDOCAINE HCL 1 % IJ SOLN
INTRAMUSCULAR | Status: DC | PRN
  Administered 2016-07-29: 8 mL

## 2016-07-29 SURGICAL SUPPLY — 45 items
APPLICATOR COTTON TIP 6IN STRL (MISCELLANEOUS) IMPLANT
BLADE CLIPPER SURG (BLADE) IMPLANT
BLADE SURG 15 STRL LF DISP TIS (BLADE) ×1 IMPLANT
BLADE SURG 15 STRL SS (BLADE) ×1
BRIEF STRETCH FOR OB PAD LRG (UNDERPADS AND DIAPERS) ×2 IMPLANT
CANISTER SUCTION 2500CC (MISCELLANEOUS) ×2 IMPLANT
CATH FOLEY 2WAY SLVR  5CC 14FR (CATHETERS)
CATH FOLEY 2WAY SLVR 5CC 14FR (CATHETERS) IMPLANT
CATH ROBINSON RED A/P 14FR (CATHETERS) ×2 IMPLANT
COVER BACK TABLE 60X90IN (DRAPES) ×2 IMPLANT
DRAPE LG THREE QUARTER DISP (DRAPES) ×2 IMPLANT
DRAPE UNDERBUTTOCKS STRL (DRAPE) ×2 IMPLANT
ELECT REM PT RETURN 9FT ADLT (ELECTROSURGICAL) ×2
ELECTRODE REM PT RTRN 9FT ADLT (ELECTROSURGICAL) ×1 IMPLANT
GAUZE SPONGE 4X4 16PLY XRAY LF (GAUZE/BANDAGES/DRESSINGS) IMPLANT
GLOVE BIO SURGEON STRL SZ 6 (GLOVE) ×4 IMPLANT
KIT RM TURNOVER CYSTO AR (KITS) ×2 IMPLANT
LEGGING LITHOTOMY PAIR STRL (DRAPES) ×2 IMPLANT
NEEDLE HYPO 25X1 1.5 SAFETY (NEEDLE) ×2 IMPLANT
NS IRRIG 500ML POUR BTL (IV SOLUTION) ×2 IMPLANT
PACK BASIN DAY SURGERY FS (CUSTOM PROCEDURE TRAY) ×2 IMPLANT
PAD OB MATERNITY 4.3X12.25 (PERSONAL CARE ITEMS) ×2 IMPLANT
PAD PREP 24X48 CUFFED NSTRL (MISCELLANEOUS) ×2 IMPLANT
PENCIL BUTTON HOLSTER BLD 10FT (ELECTRODE) ×2 IMPLANT
SCOPETTES 8  STERILE (MISCELLANEOUS)
SCOPETTES 8 STERILE (MISCELLANEOUS) IMPLANT
SUT VIC AB 0 SH 27 (SUTURE) ×2 IMPLANT
SUT VIC AB 2-0 CT2 27 (SUTURE) IMPLANT
SUT VIC AB 2-0 SH 27 (SUTURE)
SUT VIC AB 2-0 SH 27XBRD (SUTURE) IMPLANT
SUT VIC AB 3-0 PS2 18 (SUTURE)
SUT VIC AB 3-0 PS2 18XBRD (SUTURE) IMPLANT
SUT VIC AB 3-0 SH 27 (SUTURE) ×4
SUT VIC AB 3-0 SH 27X BRD (SUTURE) ×4 IMPLANT
SUT VICRYL 2 0 18  UND BR (SUTURE)
SUT VICRYL 2 0 18 UND BR (SUTURE) IMPLANT
SUT VICRYL 4-0 PS2 18IN ABS (SUTURE) ×4 IMPLANT
SYR BULB IRRIGATION 50ML (SYRINGE) ×2 IMPLANT
SYR CONTROL 10ML LL (SYRINGE) ×2 IMPLANT
TOWEL OR 17X24 6PK STRL BLUE (TOWEL DISPOSABLE) ×2 IMPLANT
TRAY DSU PREP LF (CUSTOM PROCEDURE TRAY) ×2 IMPLANT
TUBE CONNECTING 12X1/4 (SUCTIONS) ×2 IMPLANT
VACUUM HOSE/TUBING 7/8INX6FT (MISCELLANEOUS) IMPLANT
WATER STERILE IRR 500ML POUR (IV SOLUTION) ×2 IMPLANT
YANKAUER SUCT BULB TIP NO VENT (SUCTIONS) ×2 IMPLANT

## 2016-07-29 NOTE — Interval H&P Note (Signed)
History and Physical Interval Note:  07/29/2016 9:50 AM  Hannah SaferNicole R Mckenzie  has presented today for surgery, with the diagnosis of VIN 3  The various methods of treatment have been discussed with the patient and family. After consideration of risks, benefits and other options for treatment, the patient has consented to  Procedure(s): WIDE EXCISION VULVECTOMY (N/A) as a surgical intervention .  The patient's history has been reviewed, patient examined, no change in status, stable for surgery.  I have reviewed the patient's chart and labs.  Questions were answered to the patient's satisfaction.     Quinn Axeossi, Zahava Quant Caroline

## 2016-07-29 NOTE — H&P (View-Only) (Signed)
Consult Note: Gyn-Onc  Consult was requested by Dr. Waynard ReedsKendra Mckenzie for the evaluation of Hannah Safericole R Orth 31 y.o. female  CC:  Chief Complaint  Patient presents with  . VIN-lll    Assessment/Plan:  Ms. Hannah Mckenzie  is a 31 y.o.  year old with VIN 3.  The lesion is localized to the posterior left labia minora. I believe she is a good candidate for primary excision.  I discussed the surgical procedure, its risks and anticipated recovery.  Surgery is scheduled for February 20th.   HPI: Hannah Mckenzie is a 31 year old woman who is seen in consultation at the request of Dr Waynard ReedsKendra Mckenzie for VIN3.   The patient has a history of right labian VIN in 2011 which was treated with excision.  She began experiencing vulvar irritation on the left in the summer of 2017. It was inititally treated with topical cream, however, when it persisted it was biopsied on 06/18/16 which revealed VIN III.  Current Meds:  Outpatient Encounter Prescriptions as of 07/14/2016  Medication Sig  . [DISCONTINUED] docusate sodium (COLACE) 100 MG capsule Take 1 capsule (100 mg total) by mouth 2 (two) times daily.  . [DISCONTINUED] docusate sodium (COLACE) 100 MG capsule Take 1 capsule (100 mg total) by mouth 2 (two) times daily.  . [DISCONTINUED] folic acid (FOLVITE) 1 MG tablet Take 1 mg by mouth daily.  . [DISCONTINUED] HYDROcodone-acetaminophen (VICODIN) 5-500 MG per tablet Take 1 tablet by mouth every 4 (four) hours as needed for pain.  . [DISCONTINUED] HYDROcodone-acetaminophen (VICODIN) 5-500 MG per tablet Take 1 tablet by mouth every 4 (four) hours as needed for pain.  . [DISCONTINUED] ibuprofen (ADVIL,MOTRIN) 600 MG tablet Take 1 tablet (600 mg total) by mouth every 6 (six) hours as needed for pain.  . [DISCONTINUED] ibuprofen (ADVIL,MOTRIN) 600 MG tablet Take 1 tablet (600 mg total) by mouth every 6 (six) hours as needed for pain.  . [DISCONTINUED] Prenatal Vit-Fe Fumarate-FA (PRENATAL MULTIVITAMIN) TABS Take 1  tablet by mouth daily.   No facility-administered encounter medications on file as of 07/14/2016.     Allergy: No Known Allergies  Social Hx:   Social History   Social History  . Marital status: Married    Spouse name: N/A  . Number of children: N/A  . Years of education: N/A   Occupational History  . Not on file.   Social History Main Topics  . Smoking status: Never Smoker  . Smokeless tobacco: Never Used  . Alcohol use No  . Drug use: No  . Sexual activity: Yes   Other Topics Concern  . Not on file   Social History Narrative  . No narrative on file    Past Surgical Hx:  Past Surgical History:  Procedure Laterality Date  . KNEE SURGERY    . vulvar excision     VIN III    Past Medical Hx:  Past Medical History:  Diagnosis Date  . Abnormal Pap smear   . H/O varicella   . Seizures (HCC)    unknown cause last 2012  . Vulvar intraepithelial neoplasia III (VIN III)     Past Gynecological History:  VIN 2011 No LMP recorded.  Family Hx:  Family History  Problem Relation Age of Onset  . Diabetes Maternal Grandmother   . Cancer Paternal Grandmother     lymphoma  . Heart disease Paternal Grandfather   . Birth defects Cousin     cleft palate 2nd cousin  Review of Systems:  Constitutional  Feels well,    ENT Normal appearing ears and nares bilaterally Skin/Breast  No rash, sores, jaundice, itching, dryness Cardiovascular  No chest pain, shortness of breath, or edema  Pulmonary  No cough or wheeze.  Gastro Intestinal  No nausea, vomitting, or diarrhoea. No bright red blood per rectum, no abdominal pain, change in bowel movement, or constipation.  Genito Urinary  No frequency, urgency, dysuria, + vulvar pruritis Musculo Skeletal  No myalgia, arthralgia, joint swelling or pain  Neurologic  No weakness, numbness, change in gait,  Psychology  No depression, anxiety, insomnia.   Vitals:  Blood pressure 103/72, pulse 61, temperature 97.9 F (36.6  C), temperature source Oral, resp. rate 18, height 5\' 6"  (1.676 m), weight 133 lb 12.8 oz (60.7 kg), SpO2 100 %, unknown if currently breastfeeding.  Physical Exam: WD in NAD Neck  Supple NROM, without any enlargements.  Lymph Node Survey No cervical supraclavicular or inguinal adenopathy CGenito Urinary  Vulva/vagina: Normal external female genitalia. 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 Addition of acetic acid fails to show additional lesions.  Rectal  deferred Extremities  No bilateral cyanosis, clubbing or edema.   Quinn Axeossi, Ashwin Tibbs Caroline, MD  07/14/2016, 11:20 AM

## 2016-07-29 NOTE — Anesthesia Procedure Notes (Signed)
Procedure Name: LMA Insertion Date/Time: 07/29/2016 9:56 AM Performed by: Tyrone NineSAUVE, Keyerra Lamere F Pre-anesthesia Checklist: Patient identified, Timeout performed, Emergency Drugs available, Suction available and Patient being monitored Patient Re-evaluated:Patient Re-evaluated prior to inductionOxygen Delivery Method: Circle system utilized Intubation Type: IV induction Ventilation: Mask ventilation without difficulty LMA: LMA inserted LMA Size: 4.0 Number of attempts: 1 Placement Confirmation: breath sounds checked- equal and bilateral Tube secured with: Tape Dental Injury: Teeth and Oropharynx as per pre-operative assessment

## 2016-07-29 NOTE — Transfer of Care (Signed)
Immediate Anesthesia Transfer of Care Note  Patient: Rivka Safericole R Burston  Procedure(s) Performed: Procedure(s): WIDE EXCISION VULVECTOMY (N/A)  Patient Location: PACU  Anesthesia Type:General  Level of Consciousness: awake, alert , oriented and patient cooperative  Airway & Oxygen Therapy: Patient Spontanous Breathing and Patient connected to nasal cannula oxygen  Post-op Assessment: Report given to RN and Post -op Vital signs reviewed and stable  Post vital signs: Reviewed and stable  Last Vitals:  Vitals:   07/29/16 0835  BP: 117/64  Pulse: 66  Resp: 14  Temp: 36.5 C    Last Pain:  Vitals:   07/29/16 0835  TempSrc: Oral      Patients Stated Pain Goal: 5 (07/29/16 0903)  Complications: No apparent anesthesia complications

## 2016-07-29 NOTE — Anesthesia Preprocedure Evaluation (Signed)
Anesthesia Evaluation  Patient identified by MRN, date of birth, ID band Patient awake    Reviewed: Allergy & Precautions, NPO status , Patient's Chart, lab work & pertinent test results  Airway Mallampati: I       Dental  (+) Teeth Intact   Pulmonary neg pulmonary ROS,    breath sounds clear to auscultation       Cardiovascular negative cardio ROS   Rhythm:Regular Rate:Normal     Neuro/Psych negative neurological ROS  negative psych ROS   GI/Hepatic negative GI ROS, Neg liver ROS,   Endo/Other  negative endocrine ROS  Renal/GU negative Renal ROS   Per surgeon negative genitourinary   Musculoskeletal negative musculoskeletal ROS (+)   Abdominal   Peds negative pediatric ROS (+)  Hematology negative hematology ROS (+)   Anesthesia Other Findings   Reproductive/Obstetrics negative OB ROS                             Anesthesia Physical Anesthesia Plan  ASA: I  Anesthesia Plan: General   Post-op Pain Management:    Induction: Intravenous  Airway Management Planned: LMA  Additional Equipment:   Intra-op Plan:   Post-operative Plan: Extubation in OR  Informed Consent: I have reviewed the patients History and Physical, chart, labs and discussed the procedure including the risks, benefits and alternatives for the proposed anesthesia with the patient or authorized representative who has indicated his/her understanding and acceptance.     Plan Discussed with: CRNA  Anesthesia Plan Comments:         Anesthesia Quick Evaluation

## 2016-07-29 NOTE — Anesthesia Postprocedure Evaluation (Addendum)
Anesthesia Post Note  Patient: Hannah Mckenzie R Ernest  Procedure(s) Performed: Procedure(s) (LRB): WIDE EXCISION VULVECTOMY (N/A)  Patient location during evaluation: PACU Anesthesia Type: General Level of consciousness: awake and alert Pain management: pain level controlled Vital Signs Assessment: post-procedure vital signs reviewed and stable Respiratory status: spontaneous breathing, nonlabored ventilation, respiratory function stable and patient connected to nasal cannula oxygen Cardiovascular status: blood pressure returned to baseline and stable Postop Assessment: no signs of nausea or vomiting Anesthetic complications: no       Last Vitals:  Vitals:   07/29/16 1100 07/29/16 1115  BP: 109/69 109/63  Pulse: 74 65  Resp: 15 13  Temp:      Last Pain:  Vitals:   07/29/16 1130  TempSrc:   PainSc: 5                  Mariene Dickerman,JAMES TERRILL

## 2016-07-29 NOTE — Op Note (Signed)
PATIENT: Hannah Mckenzie ENCOUNTER DATE: 07/29/16   Preop Diagnosis: VIN3  Postoperative Diagnosis: VIN3  Surgery: Partial simple left vulvectomy  Surgeons:  Quinn Axeossi, Odai Wimmer Caroline, MD Assistant: none  Anesthesia: General   Estimated blood loss: <6920ml  IVF:  200ml   Urine output: <20 ml   Complications: None   Pathology: left posterior labia minora with marking stitch at 12 o'clock anterior  Operative findings: 2cm area of acetowhite changes in interlabial fold (posterior left).  Procedure: The patient was identified in the preoperative holding area. Informed consent was signed on the chart. Patient was seen history was reviewed and exam was performed.   The patient was then taken to the operating room and placed in the supine position with SCD hose on. General anesthesia was then induced without difficulty. She was then placed in the dorsolithotomy position. The perineum was prepped with Betadine. The vagina was prepped with Betadine. The patient was then draped after the prep was dried. A Foley catheter was inserted into the bladder under sterile conditions.  Timeout was performed the patient, procedure, antibiotic, allergy, and length of procedure. 5% acetic acid solution was applied to the perineum. The vulvar tissues were inspected for areas of acetowhite changes or leukoplakia. The lesion was identified and the marking pen was used to circumscribe the area with appropriate surgical margins. The subcuticular tissues were infiltrated with 1% lidocaine. The 15 blade scalpel was used to make an incision through the skin circumferentially as marked. The skin elipse was grasped and was separated from the underlying deep dermal tissues with the bovie device. After the specimen had been completely resected, it was oriented and marked at 12 o'clock with a 0-vicryl suture. The bovie was used to obtain hemostasis at the surgical bed. The subcutaneous tissues were irrigated and made hemostatic.    The deep dermal layer was approximated with 3-0vicryl mattress sutures to bring the skin edges into approximation and off tension. The wound was closed following langher's lines. The cutaneous layer was closed with interrupted 4-0 vicryl stitches and mattress sutures to ensure a tension free and hemostatic closure. The perineum was again irrigated. The foley was removed.  All instrument, suture, laparotomy, Ray-Tec, and needle counts were correct x2. The patient tolerated the procedure well and was taken recovery room in stable condition. This is Hannah Mckenzie dictating an operative note on Hannah Mckenzie.

## 2016-07-29 NOTE — Discharge Instructions (Signed)
Vulvectomy, Care After °The vulva is the external female genitalia, outside and around the vagina and pubic bone. It consists of: °· The skin on, and in front of, the pubic bone. °· The clitoris. °· The labia majora (large lips) on the outside of the vagina. °· The labia minora (small lips) around the opening of the vagina. °· The opening and the skin in and around the vagina. °A vulvectomy is the removal of the tissue of the vulva, which sometimes includes removal of the lymph nodes and tissue in the groin areas. °These discharge instructions provide you with general information on caring for yourself after you leave the hospital. It is also important that you know the warning signs of complications, so that you can seek treatment. Please read the instructions outlined below and refer to this sheet in the next few weeks. Your caregiver may also give you specific information and medicines. If you have any questions or complications after discharge, please call your caregiver. °ACTIVITY °· Rest as much as possible the first two weeks after discharge. °· Arrange to have help from family or others with your daily activities when you go home. °· Avoid heavy lifting (more than 5 pounds), pushing, or pulling. °· If you feel tired, balance your activity with rest periods. °· Follow your caregiver's instruction about climbing stairs and driving a car. °· Increase activity gradually. °· Do not exercise until you have permission from your caregiver. °LEG AND FOOT CARE °If your doctor has removed lymph nodes from your groin area, there may be an increase in swelling of your legs and feet. You can help prevent swelling by doing the following: °· Elevate your legs while sitting or lying down. °· If your caregiver has ordered special stockings, wear them according to instructions. °· Avoid standing in one place for long periods of time. °· Call the physical therapy department if you have any questions about swelling or treatment  for swelling. °· Avoid salt in your diet. It can cause fluid retention and swelling. °· Do not cross your legs, especially when sitting. °NUTRITION °· You may resume your normal diet. °· Drink 6 to 8 glasses of fluids a day. °· Eat a healthy, balanced diet including portions of food from the meat (protein), milk, fruit, vegetable, and bread groups. °· Your caregiver may recommend you take a multivitamin with iron. °ELIMINATION °· You may notice that your stream of urine is at a different angle, and may tend to spray. Using a plastic funnel may help to decrease urine spray. °· If constipation occurs, drink more liquids, and add more fruits, vegetables, and bran to your diet. You may take a mild laxative, such as Milk of Magnesia, Metamucil, or a stool softener such as Colace, with permission from your caregiver. °HYGIENE °· You may shower and wash your hair. °· Check with your caregiver about tub baths. °· Do not add any bath oils or chemicals to your bath water, after you have permission to take baths. °· While passing urine, pour water from a bottle or spray over your vulva to dilute the urine as it passes the incision (this will decrease burning and discomfort). °· Clean yourself well after moving your bowels. °· After urinating, do not wipe. Dap or pat dry with toilet paper or a dry cleath soft cloth. °· A sitz bath will help keep your perineal area clean, reduce swelling, and provide comfort. °· Avoid wearing underpants for the first 2 weeks and wear loose skirts to   allow circulation of air around the incision  You do not need to apply dressings, salves or lotions to the wound.  The stitches are self-dissolving and will absorb and disappear over a couple of months (it is normal to notice the knot from the stitches on toilet paper after voiding).  Medications:  - Take ibuprofen and tylenol first line for pain control. Take these regularly (every 6 hours) to decrease the build up of pain.  - If necessary,  for severe pain not relieved by ibuprofen, take percocet.  - While taking percocet you should take sennakot every night to reduce the likelihood of constipation. If this causes diarrhea, stop its use.  HOME CARE INSTRUCTIONS   Apply a soft ice pack (or frozen bag of peas) to your perineum (vulva) every hour in the first 48 hours after surgery. This will reduce swelling.  Avoid activities that involve a lot of friction between your legs.  Avoid wearing pants or underpants in the 1st 2 weeks (skirts are preferable).  Take your temperature twice a day and record it, especially if you feel feverish or have chills.  Follow your caregiver's instructions about medicines, activity, and follow-up appointments after surgery.  Do not drink alcohol while taking pain medicine.  Change your dressing as advised by your caregiver.  You may take over-the-counter medicine for pain, recommended by your caregiver.  If your pain is not relieved with medicine, call your caregiver.  Do not take aspirin because it can cause bleeding.  Do not douche or use tampons (use a nonperfumed sanitary pad).  Do not have sexual intercourse until your caregiver gives you permission (typically 6 weeks postoperatively). Hugging, kissing, and playful sexual activity is fine with your caregiver's permission.  Warm sitz baths, with your caregiver's permission, are helpful to control swelling and discomfort.  Take showers instead of baths, until your caregiver gives you permission to take baths.  You may take a mild medicine for constipation, recommended by your caregiver. Bran foods and drinking a lot of fluids will help with constipation.  Make sure your family understands everything about your operation and recovery. SEEK MEDICAL CARE IF:   You notice swelling and redness around the wound area.  You notice a foul smell coming from the wound or on the surgical dressing.  You notice the wound is separating.  You  have painful or bloody urination.  You develop nausea and vomiting.  You develop diarrhea.  You develop a rash.  You have a reaction or allergy from the medicine.  You feel dizzy or light-headed.  You need stronger pain medicine. SEEK IMMEDIATE MEDICAL CARE IF:   You develop a temperature of 102 F (38.9 C) or higher.  You pass out.  You develop leg or chest pain.  You develop abdominal pain.  You develop shortness of breath.  You develop bleeding from the wound area.  You see pus in the wound area. MAKE SURE YOU:   Understand these instructions.  Will watch your condition.  Will get help right away if you are not doing well or get worse. Document Released: 01/08/2004 Document Revised: 10/10/2013 Document Reviewed: 04/27/2009 [facility] Patient Information 2015 [place], [place]. This information is not intended to replace advice given to you by your health care provider. Make sure you discuss any questions you have with your health care provider.   Post Anesthesia Home Care Instructions  Activity: Get plenty of rest for the remainder of the day. A responsible adult should stay with you  for 24 hours following the procedure.  For the next 24 hours, DO NOT: -Drive a car -Advertising copywriterperate machinery -Drink alcoholic beverages -Take any medication unless instructed by your physician -Make any legal decisions or sign important papers.  Meals: Start with liquid foods such as gelatin or soup. Progress to regular foods as tolerated. Avoid greasy, spicy, heavy foods. If nausea and/or vomiting occur, drink only clear liquids until the nausea and/or vomiting subsides. Call your physician if vomiting continues.  Special Instructions/Symptoms: Your throat may feel dry or sore from the anesthesia or the breathing tube placed in your throat during surgery. If this causes discomfort, gargle with warm salt water. The discomfort should disappear within 24 hours.  If you had a scopolamine  patch placed behind your ear for the management of post- operative nausea and/or vomiting:  1. The medication in the patch is effective for 72 hours, after which it should be removed.  Wrap patch in a tissue and discard in the trash. Wash hands thoroughly with soap and water. 2. You may remove the patch earlier than 72 hours if you experience unpleasant side effects which may include dry mouth, dizziness or visual disturbances. 3. Avoid touching the patch. Wash your hands with soap and water after contact with the patch.

## 2016-07-30 ENCOUNTER — Encounter (HOSPITAL_BASED_OUTPATIENT_CLINIC_OR_DEPARTMENT_OTHER): Payer: Self-pay | Admitting: Gynecologic Oncology

## 2016-08-01 ENCOUNTER — Telehealth: Payer: Self-pay

## 2016-08-01 NOTE — Telephone Encounter (Signed)
Told Ms Hannah Mckenzie the results of the surgical pathology an noted by Dr. Andrey Farmerossi.  Gave Ms Hannah Mckenzie her  post op appointment for 08-19-16 at 1030 with Dr. Andrey Farmerossi. Arrive at 1015 to register. Pt verbalized understanding.

## 2016-08-01 NOTE — Telephone Encounter (Signed)
-----   Message from Adolphus BirchwoodEmma Rossi, MD sent at 07/31/2016 11:51 AM EST ----- Marthe PatchLorri,  Can you let her know that her vulvectomy specimen showed no cancer, only the pre-cancerous VIN3. The margins/edges of the specimen were free of disease. No additional treatment needed at this time.  Quinn Axeossi, Emma Caroline, MD

## 2016-08-19 ENCOUNTER — Ambulatory Visit: Payer: BLUE CROSS/BLUE SHIELD | Attending: Gynecologic Oncology | Admitting: Gynecologic Oncology

## 2016-08-19 ENCOUNTER — Encounter: Payer: Self-pay | Admitting: Gynecologic Oncology

## 2016-08-19 VITALS — BP 117/79 | HR 73 | Temp 98.4°F | Resp 16 | Wt 135.1 lb

## 2016-08-19 DIAGNOSIS — D071 Carcinoma in situ of vulva: Secondary | ICD-10-CM | POA: Diagnosis not present

## 2016-08-19 NOTE — Progress Notes (Signed)
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   S/p wide local excision of left posterior labia majora on 07/29/16.   Plan: 1) Pathology reports reviewed today 2) Treatment counseling - I discussed the risk for recurrence (25%). Discussed she should inspect vulva monthly and notify Dr Tenny Crawoss if she appreciates changes. She was given the opportunity to ask questions, which were answered to her satisfaction, and she is agreement with the above mentioned plan of care.  3)  Return to clinic on a prn basis to see me. She should seen Dr Tenny Crawoss in 6 months for repeat vulvar inspection and annually thereafter.  HPI:  Hannah Mckenzie is a 31 y.o. year old G2P2002 initially seen in consultation on 07/14/16 for VIN3.  She then underwent a partial simple posterior left vulvectomy on 07/29/16 without complications.  Her postoperative course was uncomplicated.  Her final pathology revealed VIN 3 with negative margins.  She is seen today for a postoperative check and to discuss her pathology results and ongoing plan.  Since discharge from the hospital, she is feeling well.  She has improving appetite, normal bowel and bladder function, and pain controlled with minimal PO medication. She has no other complaints today.    Review of systems: Constitutional:  She has no weight gain or weight loss. She has no fever or chills. Eyes: No blurred vision Ears, Nose, Mouth, Throat: No dizziness, headaches or changes in hearing. No mouth sores. Cardiovascular: No chest pain, palpitations or edema. Respiratory:  No shortness of breath, wheezing or cough Gastrointestinal: She has normal bowel movements without diarrhea or constipation. She denies any nausea or vomiting. She denies blood in her stool or heart burn. Genitourinary:  She denies pelvic pain, pelvic pressure or changes in her urinary function. She has no hematuria, dysuria, or incontinence. She has no irregular vaginal bleeding or  vaginal discharge Musculoskeletal: Denies muscle weakness or joint pains.  Skin:  She has no skin changes, rashes or itching Neurological:  Denies dizziness or headaches. No neuropathy, no numbness or tingling. Psychiatric:  She denies depression or anxiety. Hematologic/Lymphatic:   No easy bruising or bleeding   Physical Exam: Blood pressure 117/79, pulse 73, temperature 98.4 F (36.9 C), resp. rate 16, weight 135 lb 2 oz (61.3 kg), SpO2 100 %, unknown if currently breastfeeding. General: Well dressed, well nourished in no apparent distress.   HEENT:  Normocephalic and atraumatic, no lesions.  Extraocular muscles intact. Sclerae anicteric. Pupils equal, round, reactive. No mouth sores or ulcers. Thyroid is normal size, not nodular, midline. Genitourinary: Normal EGBUS  Vulvar incision is well healed without erythema or drainage. Minimal suture material present. Extremities: No cyanosis, clubbing or edema.  No calf tenderness or erythema. No palpable cords. Psychiatric: Mood and affect are appropriate. Neurological: Awake, alert and oriented x 3. Sensation is intact, no neuropathy.  Musculoskeletal: No pain, normal strength and range of motion.   Hannah Mckenzie, Hannah Erion Caroline, MD

## 2016-08-19 NOTE — Patient Instructions (Addendum)
Dr Romie LeveeAlicia Mckenzie   [facility] Phone [phone]  Follow-up with Dr Tenny Crawoss for vulvar inspection in 6 months. Use lubricant with intercourse in about 1-2 weeks .

## 2016-11-07 NOTE — Addendum Note (Signed)
Addendum  created 11/07/16 1152 by Malikah Lakey, MD   Sign clinical note    

## 2016-12-24 DIAGNOSIS — I889 Nonspecific lymphadenitis, unspecified: Secondary | ICD-10-CM | POA: Diagnosis not present

## 2016-12-24 DIAGNOSIS — Z6821 Body mass index (BMI) 21.0-21.9, adult: Secondary | ICD-10-CM | POA: Diagnosis not present

## 2016-12-24 DIAGNOSIS — Z1389 Encounter for screening for other disorder: Secondary | ICD-10-CM | POA: Diagnosis not present

## 2017-01-20 DIAGNOSIS — N879 Dysplasia of cervix uteri, unspecified: Secondary | ICD-10-CM | POA: Diagnosis not present

## 2017-01-20 DIAGNOSIS — R591 Generalized enlarged lymph nodes: Secondary | ICD-10-CM | POA: Diagnosis not present

## 2017-01-27 DIAGNOSIS — R221 Localized swelling, mass and lump, neck: Secondary | ICD-10-CM | POA: Diagnosis not present

## 2017-01-27 DIAGNOSIS — R591 Generalized enlarged lymph nodes: Secondary | ICD-10-CM | POA: Diagnosis not present

## 2017-04-15 DIAGNOSIS — R87610 Atypical squamous cells of undetermined significance on cytologic smear of cervix (ASC-US): Secondary | ICD-10-CM | POA: Diagnosis not present

## 2017-04-15 DIAGNOSIS — Z6821 Body mass index (BMI) 21.0-21.9, adult: Secondary | ICD-10-CM | POA: Diagnosis not present

## 2017-04-15 DIAGNOSIS — Z124 Encounter for screening for malignant neoplasm of cervix: Secondary | ICD-10-CM | POA: Diagnosis not present

## 2017-04-15 DIAGNOSIS — Z01419 Encounter for gynecological examination (general) (routine) without abnormal findings: Secondary | ICD-10-CM | POA: Diagnosis not present

## 2017-06-03 DIAGNOSIS — J00 Acute nasopharyngitis [common cold]: Secondary | ICD-10-CM | POA: Diagnosis not present

## 2017-07-23 DIAGNOSIS — S8000XA Contusion of unspecified knee, initial encounter: Secondary | ICD-10-CM | POA: Diagnosis not present

## 2017-09-15 DIAGNOSIS — K602 Anal fissure, unspecified: Secondary | ICD-10-CM | POA: Diagnosis not present

## 2017-10-19 DIAGNOSIS — K602 Anal fissure, unspecified: Secondary | ICD-10-CM | POA: Diagnosis not present

## 2017-10-27 DIAGNOSIS — Z30433 Encounter for removal and reinsertion of intrauterine contraceptive device: Secondary | ICD-10-CM | POA: Diagnosis not present

## 2017-10-27 DIAGNOSIS — Z6820 Body mass index (BMI) 20.0-20.9, adult: Secondary | ICD-10-CM | POA: Diagnosis not present

## 2017-11-09 DIAGNOSIS — K602 Anal fissure, unspecified: Secondary | ICD-10-CM | POA: Diagnosis not present

## 2017-11-30 ENCOUNTER — Ambulatory Visit: Payer: Self-pay | Admitting: General Surgery

## 2017-12-14 ENCOUNTER — Encounter (HOSPITAL_BASED_OUTPATIENT_CLINIC_OR_DEPARTMENT_OTHER): Payer: Self-pay | Admitting: *Deleted

## 2017-12-14 ENCOUNTER — Other Ambulatory Visit: Payer: Self-pay

## 2017-12-14 NOTE — Progress Notes (Signed)
Spoke w/ pt via phone for pre-op interview.  Npo after mn w/ exception clear liquids until 0800 (no cream/milk products).  Arrive at 1200.  Will take nexium am dos w/ sips of water.  Needs urine preg.

## 2017-12-16 ENCOUNTER — Encounter (HOSPITAL_BASED_OUTPATIENT_CLINIC_OR_DEPARTMENT_OTHER): Payer: Self-pay | Admitting: *Deleted

## 2017-12-16 ENCOUNTER — Ambulatory Visit (HOSPITAL_BASED_OUTPATIENT_CLINIC_OR_DEPARTMENT_OTHER): Payer: BLUE CROSS/BLUE SHIELD | Admitting: Certified Registered"

## 2017-12-16 ENCOUNTER — Ambulatory Visit (HOSPITAL_BASED_OUTPATIENT_CLINIC_OR_DEPARTMENT_OTHER)
Admission: RE | Admit: 2017-12-16 | Discharge: 2017-12-16 | Disposition: A | Discharge status: Home / Self Care | Payer: BLUE CROSS/BLUE SHIELD | Source: Ambulatory Visit | Attending: General Surgery | Admitting: General Surgery

## 2017-12-16 ENCOUNTER — Other Ambulatory Visit: Payer: Self-pay

## 2017-12-16 ENCOUNTER — Encounter (HOSPITAL_BASED_OUTPATIENT_CLINIC_OR_DEPARTMENT_OTHER)
Admission: RE | Disposition: A | Discharge status: Home / Self Care | Payer: Self-pay | Source: Ambulatory Visit | Attending: General Surgery

## 2017-12-16 DIAGNOSIS — Z79899 Other long term (current) drug therapy: Secondary | ICD-10-CM | POA: Insufficient documentation

## 2017-12-16 DIAGNOSIS — K219 Gastro-esophageal reflux disease without esophagitis: Secondary | ICD-10-CM | POA: Diagnosis not present

## 2017-12-16 DIAGNOSIS — K601 Chronic anal fissure: Secondary | ICD-10-CM | POA: Diagnosis not present

## 2017-12-16 DIAGNOSIS — D071 Carcinoma in situ of vulva: Secondary | ICD-10-CM | POA: Diagnosis not present

## 2017-12-16 HISTORY — DX: Chronic anal fissure: K60.1

## 2017-12-16 HISTORY — DX: Other constipation: K59.09

## 2017-12-16 HISTORY — PX: SPHINCTEROTOMY: SHX5279

## 2017-12-16 HISTORY — DX: Gastro-esophageal reflux disease without esophagitis: K21.9

## 2017-12-16 HISTORY — DX: Personal history of other diseases of the female genital tract: Z87.42

## 2017-12-16 HISTORY — DX: Personal history of vulvar dysplasia: Z87.412

## 2017-12-16 LAB — POCT PREGNANCY, URINE: Preg Test, Ur: NEGATIVE

## 2017-12-16 SURGERY — SPHINCTEROTOMY, ANAL
Anesthesia: Monitor Anesthesia Care | Site: Rectum

## 2017-12-16 MED ORDER — OXYCODONE HCL 5 MG PO TABS
5.0000 mg | ORAL_TABLET | Freq: Once | ORAL | Status: DC | PRN
  Filled 2017-12-16: qty 1

## 2017-12-16 MED ORDER — ONABOTULINUMTOXINA 100 UNITS IJ SOLR
INTRAMUSCULAR | Status: AC
  Filled 2017-12-16: qty 100

## 2017-12-16 MED ORDER — ACETAMINOPHEN 325 MG PO TABS
650.0000 mg | ORAL_TABLET | ORAL | Status: DC | PRN
  Filled 2017-12-16: qty 2

## 2017-12-16 MED ORDER — SODIUM CHLORIDE 0.9 % IJ SOLN
INTRAMUSCULAR | Status: AC
  Filled 2017-12-16: qty 50

## 2017-12-16 MED ORDER — MEPERIDINE HCL 25 MG/ML IJ SOLN
6.2500 mg | INTRAMUSCULAR | Status: DC | PRN
  Filled 2017-12-16: qty 1

## 2017-12-16 MED ORDER — PROPOFOL 500 MG/50ML IV EMUL
INTRAVENOUS | Status: AC
  Filled 2017-12-16: qty 50

## 2017-12-16 MED ORDER — SODIUM CHLORIDE 0.9% FLUSH
3.0000 mL | Freq: Two times a day (BID) | INTRAVENOUS | Status: DC
  Filled 2017-12-16: qty 3

## 2017-12-16 MED ORDER — SODIUM CHLORIDE 0.9 % IJ SOLN
INTRAMUSCULAR | Status: DC | PRN
  Administered 2017-12-16: 50 mL

## 2017-12-16 MED ORDER — SODIUM CHLORIDE 0.9 % IV SOLN
250.0000 mL | INTRAVENOUS | Status: DC | PRN
  Filled 2017-12-16: qty 250

## 2017-12-16 MED ORDER — FENTANYL CITRATE (PF) 100 MCG/2ML IJ SOLN
25.0000 ug | INTRAMUSCULAR | Status: DC | PRN
  Filled 2017-12-16: qty 1

## 2017-12-16 MED ORDER — KETOROLAC TROMETHAMINE 30 MG/ML IJ SOLN
30.0000 mg | Freq: Once | INTRAMUSCULAR | Status: DC | PRN
  Filled 2017-12-16: qty 1

## 2017-12-16 MED ORDER — SODIUM CHLORIDE 0.9% FLUSH
3.0000 mL | INTRAVENOUS | Status: DC | PRN
  Filled 2017-12-16: qty 3

## 2017-12-16 MED ORDER — LIDOCAINE 5 % EX OINT
TOPICAL_OINTMENT | CUTANEOUS | Status: AC
  Filled 2017-12-16: qty 35.44

## 2017-12-16 MED ORDER — ACETAMINOPHEN 325 MG PO TABS
325.0000 mg | ORAL_TABLET | ORAL | Status: DC | PRN
  Filled 2017-12-16: qty 2

## 2017-12-16 MED ORDER — MIDAZOLAM HCL 2 MG/2ML IJ SOLN
INTRAMUSCULAR | Status: AC
  Filled 2017-12-16: qty 2

## 2017-12-16 MED ORDER — PROPOFOL 500 MG/50ML IV EMUL
INTRAVENOUS | Status: DC | PRN
  Administered 2017-12-16: 150 ug/kg/min via INTRAVENOUS

## 2017-12-16 MED ORDER — PROPOFOL 10 MG/ML IV BOLUS
INTRAVENOUS | Status: DC | PRN
  Administered 2017-12-16: 50 mg via INTRAVENOUS

## 2017-12-16 MED ORDER — OXYCODONE HCL 5 MG/5ML PO SOLN
5.0000 mg | Freq: Once | ORAL | Status: DC | PRN
  Filled 2017-12-16: qty 5

## 2017-12-16 MED ORDER — BUPIVACAINE-EPINEPHRINE 0.5% -1:200000 IJ SOLN
INTRAMUSCULAR | Status: DC | PRN
  Administered 2017-12-16: 20 mL

## 2017-12-16 MED ORDER — BUPIVACAINE-EPINEPHRINE (PF) 0.5% -1:200000 IJ SOLN
INTRAMUSCULAR | Status: AC
  Filled 2017-12-16: qty 30

## 2017-12-16 MED ORDER — OXYCODONE HCL 5 MG PO TABS
5.0000 mg | ORAL_TABLET | ORAL | Status: DC | PRN
  Filled 2017-12-16: qty 2

## 2017-12-16 MED ORDER — ONABOTULINUMTOXINA 100 UNITS IJ SOLR
INTRAMUSCULAR | Status: DC | PRN
  Administered 2017-12-16: 40 [IU] via INTRAMUSCULAR

## 2017-12-16 MED ORDER — CEFAZOLIN SODIUM-DEXTROSE 2-4 GM/100ML-% IV SOLN
INTRAVENOUS | Status: AC
  Filled 2017-12-16: qty 100

## 2017-12-16 MED ORDER — FENTANYL CITRATE (PF) 100 MCG/2ML IJ SOLN
INTRAMUSCULAR | Status: AC
Start: 2017-12-16 — End: ?
  Filled 2017-12-16: qty 2

## 2017-12-16 MED ORDER — FENTANYL CITRATE (PF) 100 MCG/2ML IJ SOLN
INTRAMUSCULAR | Status: DC | PRN
  Administered 2017-12-16: 50 ug via INTRAVENOUS

## 2017-12-16 MED ORDER — LACTATED RINGERS IV SOLN
INTRAVENOUS | Status: DC
  Administered 2017-12-16: 10:00:00 via INTRAVENOUS
  Filled 2017-12-16: qty 1000

## 2017-12-16 MED ORDER — MIDAZOLAM HCL 2 MG/2ML IJ SOLN
INTRAMUSCULAR | Status: DC | PRN
  Administered 2017-12-16 (×2): 1 mg via INTRAVENOUS

## 2017-12-16 MED ORDER — ACETAMINOPHEN 500 MG PO TABS
1000.0000 mg | ORAL_TABLET | ORAL | Status: AC
  Administered 2017-12-16: 1000 mg via ORAL
  Filled 2017-12-16: qty 2

## 2017-12-16 MED ORDER — ACETAMINOPHEN 650 MG RE SUPP
650.0000 mg | RECTAL | Status: DC | PRN
  Filled 2017-12-16: qty 1

## 2017-12-16 MED ORDER — BUPIVACAINE LIPOSOME 1.3 % IJ SUSP
INTRAMUSCULAR | Status: AC
  Filled 2017-12-16: qty 20

## 2017-12-16 MED ORDER — ACETAMINOPHEN 500 MG PO TABS
ORAL_TABLET | ORAL | Status: AC
  Filled 2017-12-16: qty 2

## 2017-12-16 MED ORDER — ACETAMINOPHEN 160 MG/5ML PO SOLN
325.0000 mg | ORAL | Status: DC | PRN
  Filled 2017-12-16: qty 20.3

## 2017-12-16 MED ORDER — ONDANSETRON HCL 4 MG/2ML IJ SOLN
4.0000 mg | Freq: Once | INTRAMUSCULAR | Status: DC | PRN
  Filled 2017-12-16: qty 2

## 2017-12-16 SURGICAL SUPPLY — 41 items
BENZOIN TINCTURE PRP APPL 2/3 (GAUZE/BANDAGES/DRESSINGS) ×2 IMPLANT
BLADE HEX COATED 2.75 (ELECTRODE) IMPLANT
BLADE SURG 15 STRL LF DISP TIS (BLADE) IMPLANT
BLADE SURG 15 STRL SS (BLADE)
BRIEF STRETCH FOR OB PAD LRG (UNDERPADS AND DIAPERS) ×2 IMPLANT
COVER BACK TABLE 60X90IN (DRAPES) ×2 IMPLANT
COVER MAYO STAND STRL (DRAPES) ×2 IMPLANT
DRAPE LAPAROTOMY 100X72 PEDS (DRAPES) ×2 IMPLANT
DRAPE UTILITY XL STRL (DRAPES) ×2 IMPLANT
ELECT REM PT RETURN 9FT ADLT (ELECTROSURGICAL) ×2
ELECTRODE REM PT RTRN 9FT ADLT (ELECTROSURGICAL) ×1 IMPLANT
GAUZE SPONGE 4X4 12PLY STRL (GAUZE/BANDAGES/DRESSINGS) IMPLANT
GLOVE BIO SURGEON STRL SZ 6.5 (GLOVE) ×2 IMPLANT
GLOVE BIO SURGEON STRL SZ7 (GLOVE) ×2 IMPLANT
GLOVE BIO SURGEON STRL SZ7.5 (GLOVE) ×2 IMPLANT
GLOVE BIOGEL PI IND STRL 7.0 (GLOVE) ×2 IMPLANT
GLOVE BIOGEL PI IND STRL 7.5 (GLOVE) ×1 IMPLANT
GLOVE BIOGEL PI IND STRL 8.5 (GLOVE) ×1 IMPLANT
GLOVE BIOGEL PI INDICATOR 7.0 (GLOVE) ×2
GLOVE BIOGEL PI INDICATOR 7.5 (GLOVE) ×1
GLOVE BIOGEL PI INDICATOR 8.5 (GLOVE) ×1
GLOVE INDICATOR 8.5 STRL (GLOVE) ×2 IMPLANT
GOWN STRL REUS W/TWL 2XL LVL3 (GOWN DISPOSABLE) ×2 IMPLANT
GOWN STRL REUS W/TWL LRG LVL3 (GOWN DISPOSABLE) ×4 IMPLANT
KIT TURNOVER CYSTO (KITS) ×2 IMPLANT
NDL SAFETY ECLIPSE 18X1.5 (NEEDLE) IMPLANT
NEEDLE HYPO 18GX1.5 SHARP (NEEDLE)
NEEDLE HYPO 22GX1.5 SAFETY (NEEDLE) ×2 IMPLANT
NS IRRIG 500ML POUR BTL (IV SOLUTION) ×2 IMPLANT
PACK BASIN DAY SURGERY FS (CUSTOM PROCEDURE TRAY) ×2 IMPLANT
PAD ABD 8X10 STRL (GAUZE/BANDAGES/DRESSINGS) ×2 IMPLANT
PAD ARMBOARD 7.5X6 YLW CONV (MISCELLANEOUS) IMPLANT
PENCIL BUTTON HOLSTER BLD 10FT (ELECTRODE) ×2 IMPLANT
SUT CHROMIC 2 0 SH (SUTURE) IMPLANT
SUT CHROMIC 3 0 SH 27 (SUTURE) IMPLANT
SYR CONTROL 10ML LL (SYRINGE) ×2 IMPLANT
TOWEL OR 17X24 6PK STRL BLUE (TOWEL DISPOSABLE) ×2 IMPLANT
TUBE CONNECTING 12X1/4 (SUCTIONS) ×2 IMPLANT
UNDERPAD 30X30 (UNDERPADS AND DIAPERS) ×2 IMPLANT
WATER STERILE IRR 500ML POUR (IV SOLUTION) ×2 IMPLANT
YANKAUER SUCT BULB TIP NO VENT (SUCTIONS) ×2 IMPLANT

## 2017-12-16 NOTE — Discharge Instructions (Addendum)

## 2017-12-16 NOTE — Op Note (Signed)
12/16/2017  12:35 PM  PATIENT:  Hannah Mckenzie  32 y.o. female  Patient Care Team: Physicians, [facility] as PCP - General (Family Medicine)  PRE-OPERATIVE DIAGNOSIS:  chronic anal fissure  POST-OPERATIVE DIAGNOSIS:  chronic anal fissure  PROCEDURE:  CHEMICAL SPHINCTEROTOMY BOTOX   Surgeon(s): Leighton Ruff, MD  ASSISTANT: none   ANESTHESIA:   local and MAC  SPECIMEN:  No Specimen  DISPOSITION OF SPECIMEN:  N/A  COUNTS:  YES  PLAN OF CARE: Discharge to home after PACU  PATIENT DISPOSITION:  PACU - hemodynamically stable.  INDICATION: 32 y.o. 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   OR FINDINGS: posterior midline anal fissure  DESCRIPTION: the patient was identified in the preoperative holding area and taken to the OR where they were laid on the operating room table.  MAC anesthesia was induced without difficulty. The patient was then positioned in prone jackknife position with buttocks gently taped apart.  The patient was then prepped and draped in usual sterile fashion.  SCDs were noted to be in place prior to the initiation of anesthesia. A surgical timeout was performed indicating the correct patient, procedure, positioning and need for preoperative antibiotics.  A rectal block was performed using Marcaine with epinephrine.    I began with a digital rectal exam.  There was sphincter hypertension.  I then placed a Hill-Ferguson anoscope into the anal canal and evaluated this completely.  There was no other pathology.  80 units of Botox was injected into the intersphincteric space.  The patient tolerated the procedure well.  She was awakened and sent to the PACU in stable condition.  All counts were correct per OR staff.

## 2017-12-16 NOTE — Anesthesia Preprocedure Evaluation (Signed)
Anesthesia Evaluation  Patient identified by MRN, date of birth, ID band Patient awake    Reviewed: Allergy & Precautions, NPO status , Patient's Chart, lab work & pertinent test results  Airway Mallampati: I       Dental no notable dental hx. (+) Teeth Intact   Pulmonary neg pulmonary ROS,    Pulmonary exam normal breath sounds clear to auscultation       Cardiovascular negative cardio ROS Normal cardiovascular exam Rhythm:Regular Rate:Normal     Neuro/Psych negative neurological ROS  negative psych ROS   GI/Hepatic Neg liver ROS, GERD  Medicated and Controlled,  Endo/Other  negative endocrine ROS  Renal/GU negative Renal ROS     Musculoskeletal negative musculoskeletal ROS (+)   Abdominal Normal abdominal exam  (+)   Peds  Hematology negative hematology ROS (+)   Anesthesia Other Findings   Reproductive/Obstetrics negative OB ROS                             Anesthesia Physical Anesthesia Plan  ASA: II  Anesthesia Plan: MAC   Post-op Pain Management:    Induction:   PONV Risk Score and Plan: Ondansetron and Dexamethasone  Airway Management Planned: Natural Airway, Nasal Cannula and Simple Face Mask  Additional Equipment:   Intra-op Plan:   Post-operative Plan:   Informed Consent: I have reviewed the patients History and Physical, chart, labs and discussed the procedure including the risks, benefits and alternatives for the proposed anesthesia with the patient or authorized representative who has indicated his/her understanding and acceptance.     Plan Discussed with: CRNA and Surgeon  Anesthesia Plan Comments:         Anesthesia Quick Evaluation

## 2017-12-16 NOTE — Anesthesia Postprocedure Evaluation (Signed)
Anesthesia Post Note  Patient: Hannah Mckenzie  Procedure(s) Performed: CHEMICAL SPHINCTEROTOMY BOTOX (N/A Rectum)     Patient location during evaluation: PACU Anesthesia Type: MAC Level of consciousness: awake Pain management: pain level controlled Vital Signs Assessment: post-procedure vital signs reviewed and stable Respiratory status: spontaneous breathing Cardiovascular status: stable Postop Assessment: no apparent nausea or vomiting Anesthetic complications: no    Last Vitals:  Vitals:   12/16/17 1238 12/16/17 1300  BP: 122/67 115/76  Pulse: 92 82  Resp: 17 11  Temp: 37.1 C   SpO2: 99% 100%    Last Pain:  Vitals:   12/16/17 1300  TempSrc:   PainSc: 0-No pain   Pain Goal: Patients Stated Pain Goal: 5 (12/16/17 0950)               Bradley Handyside JR,JOHN Mateo Flow

## 2017-12-16 NOTE — H&P (Signed)
History of Present Illness  The patient is a 32 year old female who presented to the office with complaints of irregular bowel habits and anal pain. This had been worse over the past year. It started with a gynecologic surgery which caused constipation. Since then she continues to have issues with pain with bowel movements as well as itching and burning. She also reports occasional bleeding passing large blood clots. She reported having a bowel movement every 2-3 days. She often has to strain to have these. Occasionally she has some loose stools and diarrhea which causes even more pain. She has never had any hemorrhoid procedures in the past. I saw her in the office in early April 2019. She was noted to have a posterior midline fissure was started on diltiazem ointment. She has been using this on a daily basis since then. She initially noticed an improvement in her symptoms but occasionally still had some discomfort. Past Medical History:  Diagnosis Date  . Chronic anal fissure   . Chronic constipation   . GERD (gastroesophageal reflux disease)   . H/O varicella   . History of abnormal cervical Pap smear   . 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 History of vulvar dysplasia    05/ 2011  --   VIN3  S/P  WLE right vulva  and vulvectomy  . Wears contact lenses    Past Surgical History:  Procedure Laterality Date  . KNEE ARTHROSCOPY Left teen  . VULVECTOMY N/A 07/29/2016   Procedure: WIDE EXCISION VULVECTOMY;  Surgeon: Adolphus Birchwood, MD;  Location: [facility];  Service: Gynecology;  Laterality: N/A;  . WIDE LOCAL EXCISION RIGHT VULVA  10/31/2009   dr Fredia Sorrow [facility]   VIN 3   Family History   Problem Relation Age of Onset  . Diabetes Maternal Grandmother   . Cancer Paternal Grandmother        lymphoma  . Heart disease Paternal Grandfather   . Birth defects Cousin        cleft palate 2nd cousin   Social History   Socioeconomic History  . Marital status: Married    Spouse name: Not on file  . Number of children: Not on file  . Years of education: Not on file  . Highest education level: Not on file  Occupational History  . Not on file  Social Needs  . Financial resource strain: Not on file  . Food insecurity:    Worry: Not on file    Inability: Not on file  . Transportation needs:    Medical: Not on file    Non-medical: Not on file  Tobacco Use  . Smoking status: Never Smoker  . Smokeless tobacco: Never Used  Substance and Sexual Activity  . Alcohol use: No  . Drug use: No  . Sexual activity: Yes    Birth control/protection: IUD    Comment: mirena iud  placed 06/2012  Lifestyle  . Physical activity:    Days per week: Not on file    Minutes per session: Not on file  . Stress: Not on file  Relationships  . Social connections:  Talks on phone: Not on file    Gets together: Not on file    Attends religious service: Not on file    Active member of club or organization: Not on file    Attends meetings of clubs or organizations: Not on file    Relationship status: Not on file  Other Topics Concern  . Not on file  Social History Narrative  . Not on file   Review of Systems - General ROS: negative for - chills or fever Respiratory ROS: no cough, shortness of breath, or wheezing Cardiovascular ROS: no chest pain or dyspnea on exertion Gastrointestinal ROS: no abdominal pain, change in bowel habits, or black or bloody stools Genito-Urinary ROS: no dysuria, trouble voiding, or hematuria  Allergies  No Known Drug Allergies [09/15/2017]: Allergies Reconciled   Medication History  Mirena (52 MG) (20MCG/24HR IUD, Intrauterine) Active. Medications  Reconciled BP 115/81   Pulse 73   Temp 98.6 F (37 C) (Oral)   Resp 14   Ht 5\' 6"  (1.676 m)   Wt 59.2 kg (130 lb 8 oz)   SpO2 100%   BMI 21.06 kg/m      Physical Exam  General Mental Status-Alert. General Appearance-Cooperative. CV: RRR Lungs:CTA Abd: soft, nontender     Assessment & Plan Hannah Mckenzie(Hannah Wease MD; 11/09/2017 9:28 AM) ANAL FISSURE (K60.2) Impression: 32 year old female who presents to the office for evaluation of an anal fissure. She has been treated with diltiazem ointment for the past 2 months. She has noticed a significant decrease in her symptoms. On exam her fissure appeared to be healing appropriately but she developed recurrent symptoms. I recommended that she consider chemical sphincterotomy.  After risks of temporary incontinence and recurrence were explained, she agreed to proceed.

## 2017-12-16 NOTE — Transfer of Care (Signed)
Immediate Anesthesia Transfer of Care Note  Patient: Hannah Mckenzie  Procedure(s) Performed: Procedure(s) (LRB): CHEMICAL SPHINCTEROTOMY BOTOX (N/A)  Patient Location: PACU  Anesthesia Type: MAC  Level of Consciousness: awake, alert , oriented and patient cooperative  Airway & Oxygen Therapy: Patient Spontanous Breathing and Patient connected to face mask oxygen  Post-op Assessment: Report given to PACU RN and Post -op Vital signs reviewed and stable  Post vital signs: Reviewed and stable  Complications: No apparent anesthesia complications  Last Vitals:  Vitals Value Taken Time  BP 115/65 12/16/2017 12:45 PM  Temp    Pulse 84 12/16/2017 12:55 PM  Resp 11 12/16/2017 12:55 PM  SpO2 100 % 12/16/2017 12:55 PM  Vitals shown include unvalidated device data.  Last Pain:  Vitals:   12/16/17 1238  TempSrc:   PainSc: 0-No pain      Patients Stated Pain Goal: 5 (12/16/17 0950)

## 2017-12-17 ENCOUNTER — Encounter (HOSPITAL_BASED_OUTPATIENT_CLINIC_OR_DEPARTMENT_OTHER): Payer: Self-pay | Admitting: General Surgery

## 2018-01-05 DIAGNOSIS — Z6821 Body mass index (BMI) 21.0-21.9, adult: Secondary | ICD-10-CM | POA: Diagnosis not present

## 2018-01-05 DIAGNOSIS — Z30431 Encounter for routine checking of intrauterine contraceptive device: Secondary | ICD-10-CM | POA: Diagnosis not present

## 2018-02-12 DIAGNOSIS — R198 Other specified symptoms and signs involving the digestive system and abdomen: Secondary | ICD-10-CM | POA: Diagnosis not present

## 2018-02-12 DIAGNOSIS — K602 Anal fissure, unspecified: Secondary | ICD-10-CM | POA: Diagnosis not present

## 2018-02-12 DIAGNOSIS — K649 Unspecified hemorrhoids: Secondary | ICD-10-CM | POA: Diagnosis not present

## 2018-02-12 DIAGNOSIS — K6289 Other specified diseases of anus and rectum: Secondary | ICD-10-CM | POA: Diagnosis not present

## 2018-03-16 DIAGNOSIS — R399 Unspecified symptoms and signs involving the genitourinary system: Secondary | ICD-10-CM | POA: Diagnosis not present

## 2018-03-17 DIAGNOSIS — K644 Residual hemorrhoidal skin tags: Secondary | ICD-10-CM | POA: Diagnosis not present

## 2018-03-17 DIAGNOSIS — K602 Anal fissure, unspecified: Secondary | ICD-10-CM | POA: Diagnosis not present

## 2018-03-17 DIAGNOSIS — K648 Other hemorrhoids: Secondary | ICD-10-CM | POA: Diagnosis not present

## 2018-03-17 DIAGNOSIS — K649 Unspecified hemorrhoids: Secondary | ICD-10-CM | POA: Diagnosis not present

## 2018-04-01 DIAGNOSIS — N3001 Acute cystitis with hematuria: Secondary | ICD-10-CM | POA: Diagnosis not present

## 2018-04-01 DIAGNOSIS — R300 Dysuria: Secondary | ICD-10-CM | POA: Diagnosis not present

## 2018-04-18 DIAGNOSIS — L259 Unspecified contact dermatitis, unspecified cause: Secondary | ICD-10-CM | POA: Diagnosis not present

## 2020-11-13 ENCOUNTER — Encounter: Payer: Self-pay | Admitting: Gastroenterology

## 2020-12-25 ENCOUNTER — Ambulatory Visit: Payer: BC Managed Care – PPO | Admitting: Gastroenterology

## 2020-12-25 ENCOUNTER — Other Ambulatory Visit: Payer: BC Managed Care – PPO

## 2020-12-25 ENCOUNTER — Encounter: Payer: Self-pay | Admitting: Gastroenterology

## 2020-12-25 VITALS — BP 122/80 | HR 56 | Ht 66.0 in | Wt 139.6 lb

## 2020-12-25 DIAGNOSIS — K581 Irritable bowel syndrome with constipation: Secondary | ICD-10-CM | POA: Diagnosis not present

## 2020-12-25 DIAGNOSIS — R140 Abdominal distension (gaseous): Secondary | ICD-10-CM | POA: Diagnosis not present

## 2020-12-25 MED ORDER — LINACLOTIDE 290 MCG PO CAPS: 290.0000 ug | ORAL_CAPSULE | Freq: Every day | ORAL | 1 refills | Status: DC

## 2020-12-25 NOTE — Patient Instructions (Signed)
If you are age 33 or older, your body mass index should be between 23-30. Your Body mass index is 22.53 kg/m. If this is out of the aforementioned range listed, please consider follow up with your Primary Care Provider.  If you are age 48 or younger, your body mass index should be between 19-25. Your Body mass index is 22.53 kg/m. If this is out of the aformentioned range listed, please consider follow up with your Primary Care Provider.   We have sent the following medications to your pharmacy for you to pick up at your convenience: Linzess 290 mcg   The [place] [facility] providers would like to encourage you to use [facility] to communicate with providers for non-urgent requests or questions.  Due to long hold times on the telephone, sending your provider a message by [facility] may be a faster and more efficient way to get a response.  Please allow 48 business hours for a response.  Please remember that this is for non-urgent requests.   It was a pleasure to see you today!  Thank you for trusting me with your gastrointestinal care!     Scott E. Tomasa Rand, MD

## 2020-12-25 NOTE — Progress Notes (Signed)
HPI : Hannah Mckenzie is a very pleasant 35 year old female referred [facility] for further evaluation of chronic symptoms of constipation, bloating and abdominal pain.  The patient states that she has had chronic GI symptoms for over 10 years.  She does recall being referred to a gastroenterologist at around age 8, but does not recall what evaluation was performed or if any diagnosis was rendered. The patient describes chronic difficulty with infrequent stools and variable stool consistency.  If she does not take a laxative, she will go a week without a bowel movement.  She has tried many different over-the-counter laxatives to include bulk laxatives, MiraLAX, Dulcolax, Senokot, magnesium oxide among others.  Currently, she is taking something called Vital Lax nightly which she bought over the Internet.  Currently, she is having a bowel movement about every 3 days or so.  Sometimes, her stools are very hard and pellet-like while others her stools are loose and poorly formed.  Regardless of her stool consistency, the patient has difficulty with straining and small volume, unsatisfactory bowel movements.  She has episodic lower crampy abdominal pain, which is usually worsened the longer she goes without a bowel movement.  Having a bowel movement does help relieve this pain. She is also bothered by chronic bloating and abdominal discomfort, which is different from her crampy abdominal pain.  The bloating is worse after meals, but can be a problem even on empty stomach. She denies excessive flatulence or belching. All the symptoms wax and wane in intensity, with some weeks better than others. She has tried modifying her diet without much success.  She tried a strict gluten-free diet for about 3 weeks but noticed no improvement.  She notes that fatty meats and cruciferous vegetables tend to worsen her symptoms.  She typically does well with fruits other than bananas.  She also tends to do well with  most carbohydrates/grains/starches.  She has a history of a chronic anal fissure status post Botox injection followed by sphincterotomy in 2019.  The fissure finally healed after the sphincterotomy, and has not been a problem since then.   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 age 28 (2009)   per pt seizure's started at age 7 and last one age 6,  work-up done by neurologist found unknown cause,  pt stated total seizure she has ever had 5 to 6   History of vulvar dysplasia    05/ 2011  --   VIN3  S/P  WLE right vulva  and vulvectomy   Wears contact lenses      Past Surgical History:  Procedure Laterality Date   KNEE ARTHROSCOPY Left teen   SPHINCTEROTOMY N/A 12/16/2017   Procedure: CHEMICAL SPHINCTEROTOMY BOTOX;  Surgeon: Romie Levee, MD;  Location: [facility];  Service: General;  Laterality: N/A;   VULVECTOMY N/A 07/29/2016   Procedure: WIDE EXCISION VULVECTOMY;  Surgeon: Adolphus Birchwood, MD;  Location: [facility];  Service: Gynecology;  Laterality: N/A;   WIDE LOCAL EXCISION RIGHT VULVA  10/31/2009   dr Fredia Sorrow [facility]   VIN 3   Family History  Problem Relation  Age of Onset   Diabetes Maternal Grandmother    Cancer Paternal Grandmother        lymphoma   Heart disease Paternal Grandfather    Birth defects Cousin        cleft palate 2nd cousin   Social History   Tobacco Use   Smoking status: Never   Smokeless tobacco: Never  Vaping Use   Vaping Use: Never used  Substance Use Topics   Alcohol use: No   Drug use: No   Current Outpatient Medications  Medication Sig Dispense Refill   Bisacodyl (LAXATIVE PO) Take by mouth daily. Vitalax OTC      levonorgestrel (MIRENA) 20 MCG/24HR IUD 1 each by Intrauterine route once. Placed 01/ 2014     Magnesium 400 MG CAPS Take 1 capsule by mouth daily.     No current facility-administered medications for this visit.   No Known Allergies   Review of Systems: All systems reviewed and negative except where noted in HPI.    No results found.  Physical Exam: BP 122/80   Pulse (!) 56   Ht 5\' 6"  (1.676 m)   Wt 139 lb 9.6 oz (63.3 kg)   BMI 22.53 kg/m  Constitutional: Pleasant,well-developed, Caucasian female in no acute distress. HEENT: Normocephalic and atraumatic. Conjunctivae are normal. No scleral icterus.  Mallampati 1 Cardiovascular: Normal rate, regular rhythm.  Pulmonary/chest: Effort normal and breath sounds normal. No wheezing, rales or rhonchi. Abdominal: Soft, nondistended, nontender. Bowel sounds active throughout. There are no masses palpable. No hepatomegaly. Extremities: no edema Neurological: Alert and oriented to person place and time. Skin: Skin is warm and dry. No rashes noted. Psychiatric: Normal mood and affect. Behavior is normal.  CBC    Component Value Date/Time   WBC 12.7 (H) 04/20/2012 0545   RBC 4.14 04/20/2012 0545   HGB 14.7 07/29/2016 0929   HCT 36.2 04/20/2012 0545   PLT 170 04/20/2012 0545   MCV 87.4 04/20/2012 0545   MCH 28.5 04/20/2012 0545   MCHC 32.6 04/20/2012 0545   RDW 13.8 04/20/2012 0545    CMP  No results found for: NA, K, CL, CO2, GLUCOSE, BUN, CREATININE, CALCIUM, PROT, ALBUMIN, AST, ALT, ALKPHOS, BILITOT, GFRNONAA, GFRAA   ASSESSMENT AND PLAN: 35 year old female with clinical history consistent with IBS-C.  Given her lack of response to traditional laxatives, there may also be an element of pelvic floor dyssynergia contributing to her constipation.  We discussed the proposed pathophysiology of IBS and gut brain axis disorders in general.  We discussed management of IBS, to include use of medications to improve bowel habits, as  needed pain medicine, centrally acting neuromodulators, role of empiric dietary modifications to include a low FODMAP diet gluten-free diet, as well as the role of cognitive therapies. Based on the patient's reported problem foods, I do not think either a low FODMAP or a gluten-free diet would be helpful for her.  I recommended a trial of Linzess 290 mcg every morning.  I am hopeful that having more regular bowel movements will improve her abdominal discomfort and bloating as well. If she continues to struggle with constipation after trial of Linzess, I think we should consider anorectal manometry. Will check for H. pylori stool antigen given predominant bloating symptomology Screen for celiac disease with TTG/IgA  IBS-C - Linzess 290 mcg every morning -H. pylori stool antigen, celiac screen - Follow-up 2 to 3 months, consider anorectal manometry if no improvement in bowel movements  I spent a total of 45  minutes reviewing the patient's medical record, interviewing and examining the patient, discussing her diagnosis and management of her condition going forward, and documenting in the medical record

## 2020-12-26 LAB — IGA: Immunoglobulin A: 209 mg/dL (ref 47–310)

## 2020-12-26 LAB — TISSUE TRANSGLUTAMINASE ABS,IGG,IGA
(tTG) Ab, IgA: <1 U/mL
(tTG) Ab, IgG: <1 U/mL

## 2021-01-22 ENCOUNTER — Telehealth: Payer: Self-pay | Admitting: Gastroenterology

## 2021-01-22 NOTE — Telephone Encounter (Signed)
Returned patients call letting her know that she had to complete the samples of Amitiza first so that we can do an appeal for Linzess. Patient stated that Amitiza was not working as well as the [employer]. I will fax over appeal to patients insurance company for Linzess.

## 2021-01-24 NOTE — Telephone Encounter (Signed)
Did an appeal with [employer] at 11:25 am with Rosanne Sack. Patient was approved for Linzess 290 mcg. Rosanne Sack stated approval should be sent this afternoon. Called patient and informed her of the approval.

## 2021-02-26 ENCOUNTER — Ambulatory Visit: Payer: BC Managed Care – PPO | Admitting: Gastroenterology

## 2021-03-11 ENCOUNTER — Other Ambulatory Visit: Payer: Self-pay

## 2021-03-11 DIAGNOSIS — K581 Irritable bowel syndrome with constipation: Secondary | ICD-10-CM

## 2021-03-11 MED ORDER — LINACLOTIDE 290 MCG PO CAPS: 290.0000 ug | ORAL_CAPSULE | Freq: Every day | ORAL | 1 refills | Status: DC

## 2021-03-20 ENCOUNTER — Encounter: Payer: Self-pay | Admitting: Gastroenterology

## 2021-03-20 ENCOUNTER — Ambulatory Visit (INDEPENDENT_AMBULATORY_CARE_PROVIDER_SITE_OTHER): Payer: BC Managed Care – PPO | Admitting: Gastroenterology

## 2021-03-20 DIAGNOSIS — K581 Irritable bowel syndrome with constipation: Secondary | ICD-10-CM | POA: Diagnosis not present

## 2021-03-20 MED ORDER — DICYCLOMINE HCL 20 MG PO TABS: 20.0000 mg | ORAL_TABLET | Freq: Four times a day (QID) | ORAL | 1 refills | Status: DC

## 2021-03-20 MED ORDER — LINACLOTIDE 290 MCG PO CAPS: 290.0000 ug | ORAL_CAPSULE | Freq: Every day | ORAL | 1 refills | Status: DC

## 2021-03-20 NOTE — Patient Instructions (Addendum)
If you are age 33 or older, your body mass index should be between 23-30. Your Body mass index is 22.27 kg/m. If this is out of the aforementioned range listed, please consider follow up with your Primary Care Provider.  If you are age 19 or younger, your body mass index should be between 19-25. Your Body mass index is 22.27 kg/m. If this is out of the aformentioned range listed, please consider follow up with your Primary Care Provider.   We have sent the following medications to your pharmacy for you to pick up at your convenience: Linzess 290 mcg  The [place] [facility] providers would like to encourage you to use [facility] to communicate with providers for non-urgent requests or questions.  Due to long hold times on the telephone, sending your provider a message by [facility] may be a faster and more efficient way to get a response.  Please allow 48 business hours for a response.  Please remember that this is for non-urgent requests.   It was a pleasure to see you today!  Thank you for trusting me with your gastrointestinal care!    Scott E. Tomasa Rand, MD

## 2021-03-22 ENCOUNTER — Encounter: Payer: Self-pay | Admitting: Gastroenterology

## 2021-03-22 NOTE — Progress Notes (Signed)
HPI : Hannah Mckenzie is a very pleasant 35 year old female with a 10+ year history of chronic abdominal pain and constipation who I saw in July and diagnosed with constipation-predominant IBS.  She was started on Linzess as she had failed numerous other over the counter laxatives previously. Today, she reports that the Linzess has helped some with her constipation, but that her abdominal pain is not improved.  She has a bowel movement about every other day, but she continues to have abdominal pain on a daily basis.  Pain is usually more bothersome in the evenings, a few hours after meals.  Because of her post prandial discomfort, she often does not feel like eating.  She notes that healthy foods like vegetables and high fiber foods tend to cause worse symptoms and that meats and processed foods are tolerated better.  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 age 50 (2009)   per pt seizure's started at age 17 and last one age 75,  work-up done by neurologist found unknown cause,  pt stated total seizure she has ever had 5 to 6   History of vulvar dysplasia    05/ 2011  --   VIN3  S/P  WLE right vulva  and vulvectomy   Wears contact lenses      Past Surgical History:  Procedure Laterality Date   KNEE ARTHROSCOPY Left teen   SPHINCTEROTOMY N/A 12/16/2017   Procedure: CHEMICAL SPHINCTEROTOMY BOTOX;  Surgeon: Romie Levee, MD;  Location: [facility];  Service: General;  Laterality: N/A;   VULVECTOMY N/A 07/29/2016   Procedure: WIDE EXCISION VULVECTOMY;  Surgeon: Adolphus Birchwood, MD;  Location: [facility];  Service: Gynecology;   Laterality: N/A;   WIDE LOCAL EXCISION RIGHT VULVA  10/31/2009   dr Fredia Sorrow [facility]   VIN 3   Family History  Problem Relation Age of Onset   Diabetes Maternal Grandmother    Cancer Paternal Grandmother        lymphoma   Heart disease Paternal Grandfather    Birth defects Cousin        cleft palate 2nd cousin   Social History   Tobacco Use   Smoking status: Never   Smokeless tobacco: Never  Vaping Use   Vaping Use: Never used  Substance Use Topics   Alcohol use: No   Drug use: No   Current Outpatient Medications  Medication Sig Dispense Refill   Bisacodyl (LAXATIVE PO) Take by mouth daily. Vitalax OTC     dicyclomine (BENTYL) 20 MG tablet Take 1 tablet (20 mg total) by mouth every 6 (six) hours. 60 tablet 1   levonorgestrel (MIRENA) 20 MCG/24HR IUD 1 each by Intrauterine route once. Placed 01/ 2014     Magnesium 400 MG CAPS Take 1 capsule by mouth daily.     linaclotide (LINZESS) 290 MCG CAPS capsule Take 1 capsule (290 mcg total) by mouth daily before breakfast. 30 capsule 1   No current facility-administered medications for this visit.   No Known Allergies   Review of Systems: All systems reviewed and  negative except where noted in HPI.    No results found.  Physical Exam: BP 100/72   Pulse 70   Ht 5\' 6"  (1.676 m)   Wt 138 lb (62.6 kg)   BMI 22.27 kg/m  Constitutional: Pleasant,well-developed, Caucasian female in no acute distress. HEENT: Normocephalic and atraumatic. Conjunctivae are normal. No scleral icterus. Neck supple.  Neurological: Alert and oriented to person place and time. Psychiatric: Normal mood and affect. Behavior is normal.  CBC    Component Value Date/Time   WBC 12.7 (H) 04/20/2012 0545   RBC 4.14 04/20/2012 0545   HGB 14.7 07/29/2016 0929   HCT 36.2 04/20/2012 0545   PLT 170 04/20/2012 0545   MCV 87.4 04/20/2012 0545   MCH 28.5 04/20/2012 0545   MCHC 32.6 04/20/2012 0545   RDW 13.8 04/20/2012 0545    CMP  No results found for: NA,  K, CL, CO2, GLUCOSE, BUN, CREATININE, CALCIUM, PROT, ALBUMIN, AST, ALT, ALKPHOS, BILITOT, GFRNONAA, GFRAA   ASSESSMENT AND PLAN: 35 year old female with IBS-C, with only modest improvement in her abdominal pain with Linzess.  Her constipation is fairly well controlled with Linzess and an OTC laxative, having a satisfactory bowel movement every other day.  She has not tried any anti-spasmodic therapies, so I think this is a reasonable next step for her.  Will try Bentyl 20 mg PO PRN every 6 hours for abdominal pain.  I also suggested trying IBGard if she doesn't feel that Bentyl helps.  We also discussed the role of centrally acting neuromodulators (TCAs) for IBS pain, but I would rather try anti-spasmodics first.   IBS-C - Continue Linzess 290 mcg daily - Add Bentyl 20 mg PO q6h prn - Patient advised to purchase IBGard OTC if no improvement in pain with Bentyl - Consider Trulance +/- Elavil as next therapy  Ski Polich E. 36, MD [facility]

## 2021-05-10 ENCOUNTER — Other Ambulatory Visit: Payer: Self-pay

## 2021-05-10 DIAGNOSIS — K581 Irritable bowel syndrome with constipation: Secondary | ICD-10-CM

## 2021-05-10 MED ORDER — LINACLOTIDE 290 MCG PO CAPS: 290.0000 ug | ORAL_CAPSULE | Freq: Every day | ORAL | 1 refills | Status: DC

## 2021-07-15 ENCOUNTER — Other Ambulatory Visit: Payer: Self-pay

## 2021-07-15 DIAGNOSIS — K581 Irritable bowel syndrome with constipation: Secondary | ICD-10-CM

## 2021-07-15 MED ORDER — LINACLOTIDE 290 MCG PO CAPS: 290.0000 ug | ORAL_CAPSULE | Freq: Every day | ORAL | 1 refills | Status: DC

## 2021-09-23 ENCOUNTER — Telehealth: Payer: Self-pay | Admitting: Gastroenterology

## 2021-09-23 ENCOUNTER — Other Ambulatory Visit: Payer: Self-pay

## 2021-09-23 DIAGNOSIS — K581 Irritable bowel syndrome with constipation: Secondary | ICD-10-CM

## 2021-09-23 MED ORDER — LINACLOTIDE 290 MCG PO CAPS: 290.0000 ug | ORAL_CAPSULE | Freq: Every day | ORAL | 1 refills | Status: DC

## 2021-09-23 NOTE — Telephone Encounter (Signed)
Refill of Linzess sent to patients pharmacy. ?

## 2021-09-23 NOTE — Telephone Encounter (Signed)
Inbound call from patient requesting refill for Linzess. Please advise.  ?

## 2021-12-20 ENCOUNTER — Other Ambulatory Visit: Payer: Self-pay | Admitting: Gastroenterology

## 2021-12-20 ENCOUNTER — Other Ambulatory Visit: Payer: Self-pay

## 2021-12-20 DIAGNOSIS — R140 Abdominal distension (gaseous): Secondary | ICD-10-CM

## 2021-12-20 DIAGNOSIS — K581 Irritable bowel syndrome with constipation: Secondary | ICD-10-CM

## 2021-12-20 MED ORDER — LINACLOTIDE 290 MCG PO CAPS: 290.0000 ug | ORAL_CAPSULE | Freq: Every day | ORAL | 1 refills | Status: DC

## 2022-01-24 ENCOUNTER — Other Ambulatory Visit (HOSPITAL_COMMUNITY): Payer: Self-pay

## 2022-01-24 ENCOUNTER — Telehealth: Payer: Self-pay | Admitting: Pharmacy Technician

## 2022-01-24 NOTE — Telephone Encounter (Signed)
Patient Advocate Encounter  Received notification from [employer] that prior authorization for LINZESS is required.   PA submitted on 8.18.23 Key [id number] Status is pending    Ricke Hey, CPhT Patient Advocate Phone: [phone]

## 2022-01-30 NOTE — Telephone Encounter (Signed)
Patient Advocate Encounter  Prior Authorization for Linzess capsules has been approved.   Effective: 01/24/2022 to 01/23/2023  Burnell Blanks, CPhT Rx Patient Advocate  Phone: [phone]

## 2022-02-12 ENCOUNTER — Other Ambulatory Visit: Payer: Self-pay

## 2022-02-12 DIAGNOSIS — K581 Irritable bowel syndrome with constipation: Secondary | ICD-10-CM

## 2022-02-12 MED ORDER — LINACLOTIDE 290 MCG PO CAPS: 290.0000 ug | ORAL_CAPSULE | Freq: Every day | ORAL | 3 refills | Status: DC

## 2022-08-05 ENCOUNTER — Ambulatory Visit: Payer: BC Managed Care – PPO | Admitting: Gastroenterology

## 2022-08-05 ENCOUNTER — Encounter: Payer: Self-pay | Admitting: Gastroenterology

## 2022-08-05 VITALS — BP 102/60 | HR 72 | Ht 66.0 in | Wt 132.0 lb

## 2022-08-05 DIAGNOSIS — K581 Irritable bowel syndrome with constipation: Secondary | ICD-10-CM | POA: Diagnosis not present

## 2022-08-05 DIAGNOSIS — R9389 Abnormal findings on diagnostic imaging of other specified body structures: Secondary | ICD-10-CM | POA: Diagnosis not present

## 2022-08-05 DIAGNOSIS — R0789 Other chest pain: Secondary | ICD-10-CM

## 2022-08-05 NOTE — Patient Instructions (Signed)
_______________________________________________________  If your blood pressure at your visit was 140/90 or greater, please contact your primary care physician to follow up on this.  _______________________________________________________  If you are age 35 or older, your body mass index should be between 23-30. Your Body mass index is 21.31 kg/m. If this is out of the aforementioned range listed, please consider follow up with your Primary Care Provider.  If you are age 40 or younger, your body mass index should be between 19-25. Your Body mass index is 21.31 kg/m. If this is out of the aformentioned range listed, please consider follow up with your Primary Care Provider.   You have been scheduled for a CT scan of the abdomen and pelvis at [facility], 1st floor Radiology. You are scheduled on 08/20/22 at [place] should arrive 15 minutes prior to your appointment time for registration.  Please follow the written instructions below on the day of your exam:   1) Do not eat anything after 12:30pm (4 hours prior to your test)    You may take any medications as prescribed with a small amount of water, if necessary. If you take any of the following medications: METFORMIN, GLUCOPHAGE, GLUCOVANCE, AVANDAMET, RIOMET, FORTAMET, [place] MET, JANUMET, GLUMETZA or METAGLIP, you MAY be asked to HOLD this medication 48 hours AFTER the exam.   The purpose of you drinking the oral contrast is to aid in the visualization of your intestinal tract. The contrast solution may cause some diarrhea. Depending on your individual set of symptoms, you may also receive an intravenous injection of x-ray contrast/dye. Plan on being at [facility] for 45 minutes or longer, depending on the type of exam you are having performed.   If you have any questions regarding your exam or if you need to reschedule, you may call [facility] at [phone] between the hours of 8:00 am and 5:00 pm, Monday-Friday.     The [place] [facility] providers would like to encourage you to use [facility] to communicate with providers for non-urgent requests or questions.  Due to long hold times on the telephone, sending your provider a message by [facility] may be a faster and more efficient way to get a response.  Please allow 48 business hours for a response.  Please remember that this is for non-urgent requests.   It was a pleasure to see you today!  Thank you for trusting me with your gastrointestinal care!    Scott E.Candis Schatz, MD

## 2022-08-05 NOTE — Progress Notes (Signed)
HPI : Hannah Mckenzie is a very pleasant 37 year old female who I initially saw in July 2022 for constipation-predominant IBS.  She was started on Linzess at that time, and she reports doing well with this medication.  She has daily bowel movements and her abdominal pain is improved.  When she does have periods of abdominal discomfort, she will adjust her diet primarily by eating smaller meals and this seems to help.  Today, the patient tells me she is here because her orthopedist recommended she see me regarding an abnormality seen on MRI of her spine last month.  The patient tells me that when she was getting her MRI, the technologist had trouble getting good images and kept asking her if she had any medical, hardware or had ever been shot in the abdomen, indicating that there must have been some signal interference.  When she saw her spine surgeon to review the MRI, the surgeon indicated that there is something abnormal around the level of L2 and her abdomen, and told the patient that she would need to get a CT scan or a colonoscopy.  A plain film of the abdomen was obtained at that time and was normal. Interestingly, the radiologist report for this MRI does not indicate any abnormalities. The patient does not have access to the MRI images and does not have a report available.  The patient has not had any new abdominal symptoms since she initially saw me in July 2022.  She has, however had a new chest discomfort that started around August of last year.  This chest discomfort is mild (3/10), sharp in character and radiates into the back.  It lasts for 3 days and then goes away.  During those 3 days, the sensation is constant.  She notes that each time it has occurred, it has been during her menstrual cycle. The pain is not burning in quality.  She denies any other GERD symptoms such as acid regurgitation, acid taste in mouth, waterbrash or nausea/vomiting.  This symptom does not have any correlation to  meals or what she eats.  No dysphagia.  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)   per pt seizure's started at age 6 and last one age 41,  work-up done by neurologist found unknown cause,  pt stated total seizure she has ever had 5 to 6   History of vulvar dysplasia    05/ 2011  --   VIN3  S/P  WLE right vulva  and vulvectomy   Wears contact lenses      Past Surgical History:  Procedure Laterality Date   KNEE ARTHROSCOPY Left teen   SPHINCTEROTOMY N/A 12/16/2017   Procedure: CHEMICAL SPHINCTEROTOMY BOTOX;  Surgeon: Leighton Ruff, MD;  Location: [facility];  Service: General;  Laterality: N/A;   VULVECTOMY N/A 07/29/2016   Procedure: WIDE EXCISION VULVECTOMY;  Surgeon: Everitt Amber, MD;  Location: [facility];  Service: Gynecology;  Laterality: N/A;   WIDE LOCAL EXCISION RIGHT VULVA  10/31/2009   dr [facility]ignon [facility]  VIN 3   Family History  Problem Relation Age of Onset   Diabetes Maternal Grandmother    Cancer Paternal Grandmother        lymphoma   Heart disease Paternal Grandfather    Birth defects Cousin        cleft palate 2nd cousin   Social History   Tobacco Use   Smoking status: Never   Smokeless tobacco: Never  Vaping Use   Vaping Use: Never used  Substance Use Topics   Alcohol use: No   Drug use: No   Current Outpatient Medications  Medication Sig Dispense Refill   Bisacodyl (LAXATIVE PO) Take by mouth daily. Vitalax OTC     gabapentin (NEURONTIN) 300 MG capsule Take 300 mg by mouth 3 (three) times daily.     levonorgestrel (MIRENA) 20 MCG/24HR IUD 1 each by  Intrauterine route once. Placed 01/ 2014     linaclotide (LINZESS) 290 MCG CAPS capsule Take 1 capsule (290 mcg total) by mouth daily before breakfast. 30 capsule 3   Magnesium 400 MG CAPS Take 1 capsule by mouth daily.     meloxicam (MOBIC) 15 MG tablet Take 15 mg by mouth daily.     traMADol (ULTRAM) 50 MG tablet Take 50 mg by mouth every 6 (six) hours as needed.     No current facility-administered medications for this visit.   No Known Allergies   Review of Systems: All systems reviewed and negative except where noted in HPI.    No results found.  Physical Exam: BP 102/60   Pulse 72   Ht '5\' 6"'$  (1.676 m)   Wt 132 lb (59.9 kg)   BMI 21.31 kg/m  Constitutional: Pleasant,well-developed, Caucasian female in no acute distress. HEENT: Normocephalic and atraumatic. Conjunctivae are normal. No scleral icterus. Neck supple.  Cardiovascular: Normal rate, regular rhythm.  Pulmonary/chest: Effort normal and breath sounds normal. No wheezing, rales or rhonchi. Abdominal: Soft, nondistended, nontender. Bowel sounds active throughout. There are no masses palpable. No hepatomegaly. Extremities: no edema Lymphadenopathy: No cervical adenopathy noted. Neurological: Alert and oriented to person place and time. Skin: Skin is warm and dry. No rashes noted. Psychiatric: Normal mood and affect. Behavior is normal.  CBC    Component Value Date/Time   WBC 12.7 (H) 04/20/2012 0545   RBC 4.14 04/20/2012 0545   HGB 14.7 07/29/2016 0929   HCT 36.2 04/20/2012 0545   PLT 170 04/20/2012 0545   MCV 87.4 04/20/2012 0545   MCH 28.5 04/20/2012 0545   MCHC 32.6 04/20/2012 0545   RDW 13.8 04/20/2012 0545    CMP  No results found for: "NA", "K", "CL", "CO2", "GLUCOSE", "BUN", "CREATININE", "CALCIUM", "PROT", "ALBUMIN", "AST", "ALT", "ALKPHOS", "BILITOT", "GFRNONAA", "GFRAA"   ASSESSMENT AND PLAN: 37 year old female with constipation predominant IBS, currently doing well on Linzess, with reported  imaging abnormality found on MRI L-spine last month, with recommendations from her orthopedist to undergo further evaluation with CT scan or colonoscopy.  She has no new or concerning symptoms and her physical exam is unremarkable.  I told the patient that I am not sure insurance would cover a CT scan if the MRI report does not mention any abnormality that requires further evaluation, but I would order it for her to provide reassurance there is nothing serious. With regards to her recurrent atypical chest pain, the symptoms do not seem consistent with any typical esophageal source of pain such as reflux or esophageal spasm.  Given the mild nature of the symptoms and  the association of symptoms with her menstrual cycle, I do not think performing an upper endoscopy, obtaining esophagram or empirically treating with PPI is currently indicated.  Should her symptoms change or worsen, these interventions can be reconsidered. Continue current management of her constipation predominant IBS (Linzess daily, Bentyl as needed)  Imaging abnormal (abdomen) - CT abdomen/pelvis with IV/oral contrast  Atypical chest pain - Reassurance, no further evaluation recommended at this time.  IBS-C - Continue daily Linzess  Trexton Escamilla E. Candis Schatz, MD [facility] [place]

## 2022-08-06 ENCOUNTER — Other Ambulatory Visit: Payer: Self-pay

## 2022-08-06 DIAGNOSIS — K581 Irritable bowel syndrome with constipation: Secondary | ICD-10-CM

## 2022-08-06 MED ORDER — LINACLOTIDE 290 MCG PO CAPS: 290.0000 ug | ORAL_CAPSULE | Freq: Every day | ORAL | 3 refills | Status: DC

## 2022-08-20 ENCOUNTER — Encounter (HOSPITAL_COMMUNITY): Payer: Self-pay

## 2022-08-20 ENCOUNTER — Ambulatory Visit (HOSPITAL_COMMUNITY): Admission: RE | Admit: 2022-08-20 | Payer: BC Managed Care – PPO | Source: Ambulatory Visit

## 2022-09-16 ENCOUNTER — Other Ambulatory Visit (HOSPITAL_COMMUNITY): Payer: Self-pay | Admitting: Neurosurgery

## 2022-09-16 ENCOUNTER — Ambulatory Visit (HOSPITAL_COMMUNITY)
Admission: RE | Admit: 2022-09-16 | Discharge: 2022-09-16 | Disposition: A | Discharge status: Home / Self Care | Payer: BC Managed Care – PPO | Source: Ambulatory Visit | Attending: Vascular Surgery | Admitting: Vascular Surgery

## 2022-09-16 DIAGNOSIS — R609 Edema, unspecified: Secondary | ICD-10-CM

## 2023-02-04 ENCOUNTER — Other Ambulatory Visit: Payer: Self-pay

## 2023-02-04 DIAGNOSIS — K581 Irritable bowel syndrome with constipation: Secondary | ICD-10-CM

## 2023-02-04 MED ORDER — LINACLOTIDE 290 MCG PO CAPS: 290.0000 ug | ORAL_CAPSULE | Freq: Every day | ORAL | 3 refills | Status: AC

## 2023-03-03 ENCOUNTER — Telehealth: Payer: Self-pay | Admitting: Gastroenterology

## 2023-03-03 NOTE — Telephone Encounter (Signed)
Inbound call from patient states she needs a PA for linzess. Please advise.   Thank you

## 2023-03-18 ENCOUNTER — Other Ambulatory Visit (HOSPITAL_COMMUNITY): Payer: Self-pay

## 2023-03-18 ENCOUNTER — Telehealth: Payer: Self-pay

## 2023-03-18 NOTE — Telephone Encounter (Signed)
PA request has been Submitted. New Encounter created for follow up. For additional info see Pharmacy Prior Auth telephone encounter from 03/18/2023.

## 2023-03-18 NOTE — Telephone Encounter (Signed)
Pharmacy Patient Advocate Encounter   Received notification from RX Request Messages that prior authorization for Linzess 290 mcg Capsules is required/requested.   Insurance verification completed.   The patient is insured through [facility] .   Per test claim: PA required; PA started via CoverMyMeds. KEY [id number] . Waiting for clinical questions to populate.

## 2023-08-31 ENCOUNTER — Telehealth: Payer: Self-pay

## 2023-08-31 ENCOUNTER — Other Ambulatory Visit (HOSPITAL_COMMUNITY): Payer: Self-pay

## 2023-08-31 NOTE — Telephone Encounter (Signed)
 Pharmacy Patient Advocate Encounter  Additional information has been requested from the patient's insurance in order to proceed with the prior authorization request. Requested information has been sent, or form has been filled out and faxed back to [phone]

## 2023-08-31 NOTE — Telephone Encounter (Signed)
 Pharmacy Patient Advocate Encounter   Received notification from CoverMyMeds that prior authorization for Linzess 290 mg Capsules is required/requested.   Insurance verification completed.   The patient is insured through [facility] .   Per test claim: PA required; PA submitted to above mentioned insurance via CoverMyMeds Key/confirmation #/EOC [id number] Status is pending

## 2023-09-01 NOTE — Telephone Encounter (Signed)
 Pharmacy Patient Advocate Encounter  Received notification from [facility] that Prior Authorization for Linzess capsules has been APPROVED from 08-31-2023 to 08-31-2023   PA #/Case ID/Reference #: [id number]

## 2023-09-02 NOTE — Telephone Encounter (Signed)
 Contacted [facility] and informed them that PA has been approved and they would get it ready for the patient.
